# Patient Record
Sex: Male | Born: 1975 | Race: White | Hispanic: No | Marital: Married | Smoking: Never smoker
Health system: Southern US, Community
[De-identification: ages and names within clinical notes are randomized; demographics above are authoritative.]

## PROBLEM LIST (undated history)

## (undated) DIAGNOSIS — G43909 Migraine, unspecified, not intractable, without status migrainosus: Secondary | ICD-10-CM

## (undated) DIAGNOSIS — N4400 Torsion of testis, unspecified: Secondary | ICD-10-CM

## (undated) DIAGNOSIS — A4902 Methicillin resistant Staphylococcus aureus infection, unspecified site: Secondary | ICD-10-CM

## (undated) DIAGNOSIS — R569 Unspecified convulsions: Secondary | ICD-10-CM

## (undated) DIAGNOSIS — M4690 Unspecified inflammatory spondylopathy, site unspecified: Secondary | ICD-10-CM

## (undated) HISTORY — PX: SURGERY SCROTAL / TESTICULAR: SUR1316

---

## 2004-03-09 ENCOUNTER — Ambulatory Visit (HOSPITAL_COMMUNITY): Admission: RE | Admit: 2004-03-09 | Discharge: 2004-03-09 | Payer: Self-pay | Admitting: *Deleted

## 2004-03-09 ENCOUNTER — Encounter (INDEPENDENT_AMBULATORY_CARE_PROVIDER_SITE_OTHER): Payer: Self-pay | Admitting: Specialist

## 2005-05-20 ENCOUNTER — Ambulatory Visit (HOSPITAL_COMMUNITY): Admission: RE | Admit: 2005-05-20 | Discharge: 2005-05-20 | Payer: Self-pay | Admitting: *Deleted

## 2005-05-20 ENCOUNTER — Encounter (INDEPENDENT_AMBULATORY_CARE_PROVIDER_SITE_OTHER): Payer: Self-pay | Admitting: *Deleted

## 2006-07-17 ENCOUNTER — Ambulatory Visit (HOSPITAL_COMMUNITY): Admission: RE | Admit: 2006-07-17 | Discharge: 2006-07-17 | Payer: Self-pay

## 2006-08-31 ENCOUNTER — Emergency Department (HOSPITAL_COMMUNITY): Admission: EM | Admit: 2006-08-31 | Discharge: 2006-08-31 | Payer: Self-pay | Admitting: Emergency Medicine

## 2007-03-30 DIAGNOSIS — M457 Ankylosing spondylitis of lumbosacral region: Secondary | ICD-10-CM

## 2007-03-30 HISTORY — DX: Ankylosing spondylitis of lumbosacral region: M45.7

## 2010-06-29 ENCOUNTER — Other Ambulatory Visit: Payer: Self-pay | Admitting: Internal Medicine

## 2010-06-29 DIAGNOSIS — M542 Cervicalgia: Secondary | ICD-10-CM

## 2010-07-01 ENCOUNTER — Emergency Department (HOSPITAL_COMMUNITY): Payer: BC Managed Care – PPO

## 2010-07-01 ENCOUNTER — Emergency Department (HOSPITAL_COMMUNITY)
Admission: EM | Admit: 2010-07-01 | Discharge: 2010-07-01 | Disposition: A | Discharge status: Home / Self Care | Payer: BC Managed Care – PPO | Attending: Emergency Medicine | Admitting: Emergency Medicine

## 2010-07-01 DIAGNOSIS — R569 Unspecified convulsions: Secondary | ICD-10-CM | POA: Insufficient documentation

## 2010-07-01 DIAGNOSIS — R51 Headache: Secondary | ICD-10-CM | POA: Insufficient documentation

## 2010-07-01 LAB — POCT I-STAT, CHEM 8
Glucose, Bld: 95 mg/dL (ref 70–99)
HCT: 42 % (ref 39.0–52.0)
Hemoglobin: 14.3 g/dL (ref 13.0–17.0)
Potassium: 3.8 mEq/L (ref 3.5–5.1)
Sodium: 140 mEq/L (ref 135–145)
TCO2: 27 mmol/L (ref 0–100)

## 2010-07-02 ENCOUNTER — Other Ambulatory Visit: Payer: BC Managed Care – PPO

## 2010-07-02 ENCOUNTER — Other Ambulatory Visit: Payer: Self-pay

## 2010-07-30 NOTE — Op Note (Signed)
Bruce Valentine, KISSNER        ACCOUNT NO.:  [id number]   MEDICAL RECORD NO.:  [id number]          PATIENT TYPE:  AMB   LOCATION:  ENDO                         FACILITY:  [facility]   PHYSICIAN:  Georgiana Spinner, M.D.    DATE OF BIRTH:  [birth year]   DATE OF PROCEDURE:  05/20/2005  DATE OF DISCHARGE:                                 OPERATIVE REPORT   PROCEDURE:  Upper endoscopy.   ENDOSCOPIST:  Georgiana Spinner, M.D.   INDICATIONS:  Acute GI bleeding.   ANESTHESIA:  Demerol 70, Versed 7.5 mg.   DESCRIPTION OF PROCEDURE:  With the patient mildly sedated in the left  lateral decubitus position, the Olympus videoscopic endoscope was inserted  in the mouth and passed under direct vision through the esophagus which  appeared normal into the stomach.  The fundus, body and antrum were  visualized as was duodenal bulb, second portion duodenum.  From this point,  the endoscope was slowly withdrawn taking circumferential views of duodenal  mucosa until the endoscope had been pulled back into the stomach, at which  point an ulcer was seen between the folds.  With some insufflation we were  above this area.  It had a flat white base of approximately 1 cm in size,  and it was photographed and biopsied around the periphery of the ulcer.  The  endoscope was placed in retroflexion to view the stomach from below.  The  endoscope was then straightened and withdrawn taking circumferential views  of the remaining gastric and esophageal mucosa.  The patient's vital signs,  pulse oximeter remained stable.  The patient tolerated procedure well  without apparent complications.   FINDINGS:  Small erosions in the duodenal bulb and an approximately 1 cm in  size ulcer with a fairly bland base and benign-appearing surrounding mucosa  was biopsied.   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  We will follow  him as an  outpatient.  Await biopsy report. The patient will call me for  results and follow up with me as an outpatient.           ______________________________  Georgiana Spinner, M.D.     GMO/MEDQ  D:  05/20/2005  T:  05/21/2005  Job:  [id number]

## 2010-07-30 NOTE — Op Note (Signed)
NAMEGURKIRAT, BASHER        ACCOUNT NO.:  [id number]   MEDICAL RECORD NO.:  [id number]          PATIENT TYPE:  AMB   LOCATION:  ENDO                         FACILITY:  [facility]   PHYSICIAN:  Georgiana Spinner, M.D.    DATE OF BIRTH:  May 24, 1975   DATE OF PROCEDURE:  03/09/2004  DATE OF DISCHARGE:                                 OPERATIVE REPORT   PROCEDURE:  Colonoscopy.   ENDOSCOPIST:  Georgiana Spinner, M.D.   INDICATIONS FOR PROCEDURE:  Rectal bleeding with diarrhea.   ANESTHESIA:  Demerol 100 mg, Versed 12.5 mg.   DESCRIPTION OF PROCEDURE:  With the patient mildly sedated in the left  lateral decubitus position, the Olympus videoscopic colonoscope was inserted  in the rectum.  After a normal rectal examination, it passed under direct  vision to the cecum, identified by crow's foot of the cecum and the  ileocecal valve, both of which were photographed.  We entered easily into  the terminal ileum which also appeared normal and was photographed only.  From this point the colonoscope was slowly withdrawn, taking circumferential  views of the colon mucosa, stopping in the descending colon where the mucosa  appeared in spots hyperemic.  There appeared to be some aphthous  erosions/ulcerations.  Photographs and biopsies were taken of these, and we  pulled back all the way to the rectum, where it showed some changes in the  rectum of inflammatory change, which was photographed in direct view and  biopsied in direct view, and in a retroflexed view photographs were taken.  The endoscope was straightened and withdrawn.  The patient's vital signs and  pulse oximeter remained stable.  The patient tolerated the procedure well without apparent complications.   FINDINGS:  1.  Distal proctitis, biopsied.  Await biopsy report.  2.  Sporadic changes through the colon of hyperemia and possibly aphthous      ulceration.  Again, await biopsy report.  The patient will call me for      results and  follow up with me as an outpatient.       GMO/MEDQ  D:  03/09/2004  T:  03/09/2004  Job:  [id number]

## 2011-02-22 ENCOUNTER — Emergency Department (HOSPITAL_COMMUNITY)
Admission: EM | Admit: 2011-02-22 | Discharge: 2011-02-22 | Disposition: A | Discharge status: Home / Self Care | Payer: BC Managed Care – PPO | Attending: Emergency Medicine | Admitting: Emergency Medicine

## 2011-02-22 DIAGNOSIS — R062 Wheezing: Secondary | ICD-10-CM

## 2011-02-22 DIAGNOSIS — R0609 Other forms of dyspnea: Secondary | ICD-10-CM | POA: Insufficient documentation

## 2011-02-22 DIAGNOSIS — G40909 Epilepsy, unspecified, not intractable, without status epilepticus: Secondary | ICD-10-CM | POA: Insufficient documentation

## 2011-02-22 DIAGNOSIS — R0989 Other specified symptoms and signs involving the circulatory and respiratory systems: Secondary | ICD-10-CM | POA: Insufficient documentation

## 2011-02-22 DIAGNOSIS — Z79899 Other long term (current) drug therapy: Secondary | ICD-10-CM | POA: Insufficient documentation

## 2011-02-22 DIAGNOSIS — R21 Rash and other nonspecific skin eruption: Secondary | ICD-10-CM

## 2011-02-22 HISTORY — DX: Unspecified inflammatory spondylopathy, site unspecified: M46.90

## 2011-02-22 HISTORY — DX: Unspecified convulsions: R56.9

## 2011-02-22 HISTORY — DX: Methicillin resistant Staphylococcus aureus infection, unspecified site: A49.02

## 2011-02-22 HISTORY — DX: Torsion of testis, unspecified: N44.00

## 2011-02-22 MED ORDER — FAMOTIDINE 20 MG PO TABS: 20.0000 mg | ORAL_TABLET | Freq: Two times a day (BID) | ORAL | Status: DC

## 2011-02-22 MED ORDER — ALBUTEROL SULFATE HFA 108 (90 BASE) MCG/ACT IN AERS
INHALATION_SPRAY | RESPIRATORY_TRACT | Status: AC
  Filled 2011-02-22: qty 6.7

## 2011-02-22 MED ORDER — ALBUTEROL SULFATE HFA 108 (90 BASE) MCG/ACT IN AERS: 2.0000 | INHALATION_SPRAY | RESPIRATORY_TRACT | Status: DC | PRN

## 2011-02-22 MED ORDER — FAMOTIDINE 20 MG PO TABS
20.0000 mg | ORAL_TABLET | Freq: Once | ORAL | Status: AC
  Administered 2011-02-22: 20 mg via ORAL
  Filled 2011-02-22: qty 1

## 2011-02-22 MED ORDER — ALBUTEROL SULFATE HFA 108 (90 BASE) MCG/ACT IN AERS
1.0000 | INHALATION_SPRAY | RESPIRATORY_TRACT | Status: DC | PRN
  Administered 2011-02-22: 2 via RESPIRATORY_TRACT

## 2011-02-22 MED ORDER — IPRATROPIUM BROMIDE 0.02 % IN SOLN
0.5000 mg | RESPIRATORY_TRACT | Status: DC
  Administered 2011-02-22: 0.5 mg via RESPIRATORY_TRACT
  Filled 2011-02-22: qty 2.5

## 2011-02-22 MED ORDER — ALBUTEROL SULFATE (5 MG/ML) 0.5% IN NEBU
2.5000 mg | INHALATION_SOLUTION | RESPIRATORY_TRACT | Status: DC
  Administered 2011-02-22: 2.5 mg via RESPIRATORY_TRACT
  Filled 2011-02-22: qty 0.5

## 2011-02-22 NOTE — ED Provider Notes (Signed)
History     CSN: [id number] Arrival date & time: 02/22/2011  2:12 PM   First MD Initiated Contact with Patient 02/22/11 1443      No chief complaint on file.   (Consider location/radiation/quality/duration/timing/severity/associated sxs/prior treatment) HPI The patient presents with concerns of wheezing. Notably, the patient has a history of enclosing spondylitis with chronic Enbrel use. He was in his usual state of health until awakening 2 days ago. He does not recall particularly different activities, medications, diet prior to that time, but he woke with rash and wheezing with mild dyspnea. The rash was a lateral upper extremity and across the chest. He denied any pain, lightheadedness. With those symptoms he went to an urgent care clinic where he was started on prednisone, Benadryl. He notes that over the interval 2 days his symptoms have improved substantially with near resolution of rash. He presents today because he is wheezing recur. He notes the recurrence happen spontaneously without clear precipitant, and has not improved in spite of taking today's prednisone and Benadryl. He denies any chest pain, lightheadedness, vomiting, nausea, new rash.  Past Medical History  Diagnosis Date  . Spondylitis   . MRSA (methicillin resistant Staphylococcus aureus)   . Testicular torsion   . Seizure     History reviewed. No pertinent past surgical history.  No family history on file.  History  Substance Use Topics  . Smoking status: Never Smoker   . Smokeless tobacco: Current User  . Alcohol Use: No      Review of Systems  Constitutional:       Per HPI, otherwise negative  HENT:       Per HPI, otherwise negative  Eyes: Negative.   Respiratory:       Per HPI, otherwise negative  Cardiovascular:       Per HPI, otherwise negative  Gastrointestinal: Negative for vomiting.  Genitourinary: Negative.   Musculoskeletal:       Per HPI, otherwise negative  Skin: Negative.     Neurological: Negative for syncope.    Allergies  Review of patient's allergies indicates no known allergies.  Home Medications   Current Outpatient Rx  Name Route Sig Dispense Refill  . ETANERCEPT 50 MG/ML [place] SOLN Subcutaneous Inject 50 mg into the skin once a week. On Sunday     . PREDNISONE 20 MG PO TABS Oral Take 20 mg by mouth See admin instructions. Taper dose 3 tablet for 3 days, then 2 tablet for 3 days, then 1 tablet for 3 days.  60mg   dose today 02/22/11       BP 145/105  Pulse 94  Temp(Src) 98.3 F (36.8 C) (Oral)  Resp 18  SpO2 96%  Physical Exam  Nursing note and vitals reviewed. Constitutional: He is oriented to person, place, and time. He appears well-developed and well-nourished.  HENT:  Head: Normocephalic and atraumatic.  Eyes: Conjunctivae are normal. Pupils are equal, round, and reactive to light.  Neck: Neck supple.  Cardiovascular: Normal rate and regular rhythm.   Pulmonary/Chest: Breath sounds normal. No respiratory distress.       Inspiratory wheeze audible  Abdominal: Soft. There is no tenderness.  Musculoskeletal: He exhibits no edema.  Neurological: He is alert and oriented to person, place, and time.  Skin: Skin is warm and dry. Rash noted.       There is a patchy, minimally raised erythematous rash to the right and left anterior arms, less substantially there is a similar rash about the superior chest.  Psychiatric: He has a normal mood and affect.    ED Course  Procedures (including critical care time)  Labs Reviewed - No data to display No results found.   No diagnosis found.  Pulse ox 97% on room air normal  MDM  This previously well young male presents with several days of wheezing, prior rash which is now resolving. On exam the patient's inspiratory wheeze is audible, though his lung sounds are clear. The patient received one albuterol nebulizer treatment with resolution of his wheezing. Given the patient's description of  symptoms is concern for ongoing inflammatory state. The patient was advised to continue taking prednisone 60 mg daily for a total of 5 days, as well as to continue Benadryl and Pepcid then to followup with his primary care physician and/or rheumatologist.        Gerhard Munch, MD 02/22/11 1630

## 2011-02-22 NOTE — ED Notes (Signed)
Seen by pmd for allergic rx to unknown source and taking meds as prescribed. Pt still with hives under arms and redness to face and trunk. Lung sounds clear to auscultation.

## 2012-02-23 ENCOUNTER — Encounter (HOSPITAL_COMMUNITY): Payer: Self-pay

## 2012-02-23 ENCOUNTER — Emergency Department (HOSPITAL_COMMUNITY)
Admission: EM | Admit: 2012-02-23 | Discharge: 2012-02-23 | Disposition: A | Discharge status: Home / Self Care | Payer: BC Managed Care – PPO | Attending: Emergency Medicine | Admitting: Emergency Medicine

## 2012-02-23 DIAGNOSIS — G40909 Epilepsy, unspecified, not intractable, without status epilepticus: Secondary | ICD-10-CM | POA: Insufficient documentation

## 2012-02-23 DIAGNOSIS — M479 Spondylosis, unspecified: Secondary | ICD-10-CM | POA: Insufficient documentation

## 2012-02-23 DIAGNOSIS — R112 Nausea with vomiting, unspecified: Secondary | ICD-10-CM | POA: Insufficient documentation

## 2012-02-23 DIAGNOSIS — Z79899 Other long term (current) drug therapy: Secondary | ICD-10-CM | POA: Insufficient documentation

## 2012-02-23 DIAGNOSIS — Z8669 Personal history of other diseases of the nervous system and sense organs: Secondary | ICD-10-CM | POA: Insufficient documentation

## 2012-02-23 DIAGNOSIS — G43909 Migraine, unspecified, not intractable, without status migrainosus: Secondary | ICD-10-CM | POA: Insufficient documentation

## 2012-02-23 DIAGNOSIS — Z8614 Personal history of Methicillin resistant Staphylococcus aureus infection: Secondary | ICD-10-CM | POA: Insufficient documentation

## 2012-02-23 HISTORY — DX: Migraine, unspecified, not intractable, without status migrainosus: G43.909

## 2012-02-23 MED ORDER — KETOROLAC TROMETHAMINE 30 MG/ML IJ SOLN
30.0000 mg | Freq: Once | INTRAMUSCULAR | Status: AC
  Administered 2012-02-23: 30 mg via INTRAVENOUS
  Filled 2012-02-23: qty 1

## 2012-02-23 MED ORDER — SODIUM CHLORIDE 0.9 % IV BOLUS (SEPSIS)
1000.0000 mL | Freq: Once | INTRAVENOUS | Status: AC
  Administered 2012-02-23: 1000 mL via INTRAVENOUS

## 2012-02-23 MED ORDER — METOCLOPRAMIDE HCL 5 MG/ML IJ SOLN
10.0000 mg | Freq: Once | INTRAMUSCULAR | Status: AC
  Administered 2012-02-23: 10 mg via INTRAVENOUS
  Filled 2012-02-23: qty 2

## 2012-02-23 MED ORDER — DIPHENHYDRAMINE HCL 50 MG/ML IJ SOLN
25.0000 mg | Freq: Once | INTRAMUSCULAR | Status: AC
  Administered 2012-02-23: 25 mg via INTRAVENOUS
  Filled 2012-02-23: qty 1

## 2012-02-23 NOTE — ED Provider Notes (Signed)
History     CSN: [id number]  Arrival date & time 02/23/12  1108   First MD Initiated Contact with Patient 02/23/12 1316      Chief Complaint  Patient presents with  . Headache    (Consider location/radiation/quality/duration/timing/severity/associated sxs/prior treatment) Patient is a 36 y.o. male presenting with headaches.  Headache  This is a recurrent problem. The current episode started 2 days ago. The headache is associated with bright light. The pain is located in the temporal region. The quality of the pain is described as throbbing. The pain is at a severity of 10/10. The pain is moderate. The pain does not radiate. Associated symptoms include near-syncope, shortness of breath, nausea and vomiting. Pertinent negatives include no fever. Treatments tried: replax. The treatment provided mild relief.   36 year old male coming in with his typical migraine headache. Today the pain is on his left temporal x2 days with nausea and vomiting x1. Patient is taking relpax for his migraine headaches. He goes to Dr. Glyn Ade in [facility] a neurologist for his migraine headaches. This headache started yesterday around 10 AM. 10 throbbing pain 8/10. He has had migraine headaches for 3 years. His last migraine headache was 9 months ago. States that he gets a metallic taste arches migraine headaches and they usually come when he is dehydrated. He is has increased stress at his job. Denies fever or neck pain today. Neuro intact.   Past Medical History  Diagnosis Date  . Spondylitis   . MRSA (methicillin resistant Staphylococcus aureus)   . Testicular torsion   . Seizure   . Migraines     Past Surgical History  Procedure Date  . Surgery scrotal / testicular     for torsion    No family history on file.  History  Substance Use Topics  . Smoking status: Never Smoker   . Smokeless tobacco: Current User  . Alcohol Use: No      Review of Systems  Constitutional: Negative.  Negative  for fever.  HENT: Negative for neck pain, neck stiffness and sinus pressure.   Eyes: Negative.   Respiratory: Positive for shortness of breath.   Cardiovascular: Positive for near-syncope.  Gastrointestinal: Positive for nausea and vomiting.  Musculoskeletal: Negative for back pain.  Neurological: Positive for light-headedness and headaches. Negative for dizziness, facial asymmetry, weakness and numbness.  Psychiatric/Behavioral: Negative.  The patient is not nervous/anxious.   All other systems reviewed and are negative.    Allergies  Review of patient's allergies indicates no known allergies.  Home Medications   Current Outpatient Rx  Name  Route  Sig  Dispense  Refill  . ELETRIPTAN HYDROBROMIDE 40 MG PO TABS   Oral   One tablet by mouth at onset of headache. May repeat in 2 hours if headache persists or recurs. may repeat in 2 hours if necessary         . ETANERCEPT 50 MG/ML [place] SOLN   Subcutaneous   Inject 50 mg into the skin once a week. On Sunday            BP 152/83  Pulse 69  Temp 97.8 F (36.6 C) (Oral)  Resp 18  Ht 5\' 7"  (1.702 m)  Wt 170 lb (77.111 kg)  BMI 26.63 kg/m2  SpO2 97%  Physical Exam  Nursing note and vitals reviewed. Constitutional: He is oriented to person, place, and time. He appears well-developed and well-nourished.  HENT:  Head: Normocephalic.  Eyes: Conjunctivae normal and EOM are normal. Pupils  are equal, round, and reactive to light.  Neck: Normal range of motion. Neck supple.  Cardiovascular: Normal rate.   Pulmonary/Chest: Effort normal and breath sounds normal. No respiratory distress.  Abdominal: Soft.  Musculoskeletal: Normal range of motion.  Neurological: He is alert and oriented to person, place, and time. He has normal reflexes. No cranial nerve deficit. Coordination normal.  Skin: Skin is warm and dry.  Psychiatric: He has a normal mood and affect.    ED Course  Procedures (including critical care time)  Labs  Reviewed - No data to display No results found.   No diagnosis found.    MDM  36 year old coming in with his typical migraine headache. Migraine cocktail was given in the ER with good relief. Patient will followup with Dr. Glyn Ade his neurologist in [place] this week. Patient understands to return for worsening symptoms.        Remi Haggard, NP 02/24/12 [phone]

## 2012-02-23 NOTE — ED Notes (Addendum)
Headache started bothering him this morning. Also has N/V with it. Pt has a hx of migraines. Pt sts he has fainted twice in the past two weeks. Had an EKG completed in the urgent care and referred to cardiologist from there.

## 2012-02-23 NOTE — ED Notes (Signed)
IV start unsuccessful x 2. 

## 2012-02-24 NOTE — ED Provider Notes (Signed)
Medical screening examination/treatment/procedure(s) were performed by non-physician practitioner and as supervising physician I was immediately available for consultation/collaboration.   Laray Anger, DO 02/24/12 [phone]

## 2012-03-19 ENCOUNTER — Institutional Professional Consult (permissible substitution): Payer: BC Managed Care – PPO | Admitting: Cardiovascular Disease

## 2012-06-03 IMAGING — CT CT HEAD W/O CM
1 of 2 series · 13 of 30 positions shown, 17 images · non-contrast
Comparison: 08/31/2006.

CLINICAL DATA: New onset seizures.

CT HEAD WITHOUT CONTRAST
TECHNIQUE: Contiguous axial images were obtained from the base of
the skull through the vertex without contrast.

[Series 2: brain · axial · 0.47mm/px · z∈[+156,+287]mm · 13 of 32 slices shown, 17 images]
[im 3/32  brain]
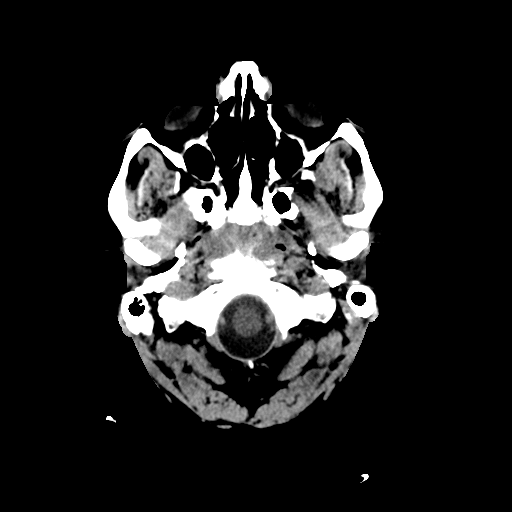
[im 3/32  bone]
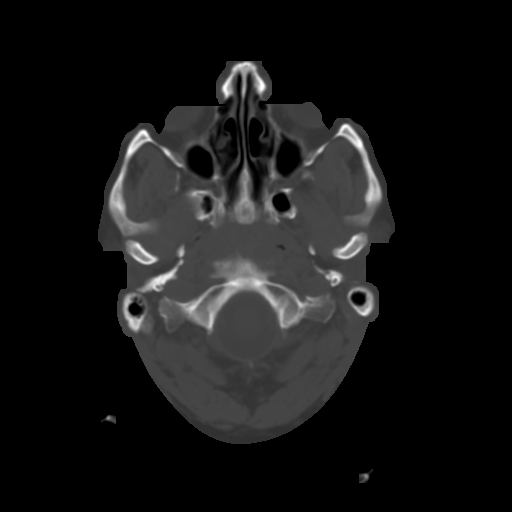
[im 5/32  brain]
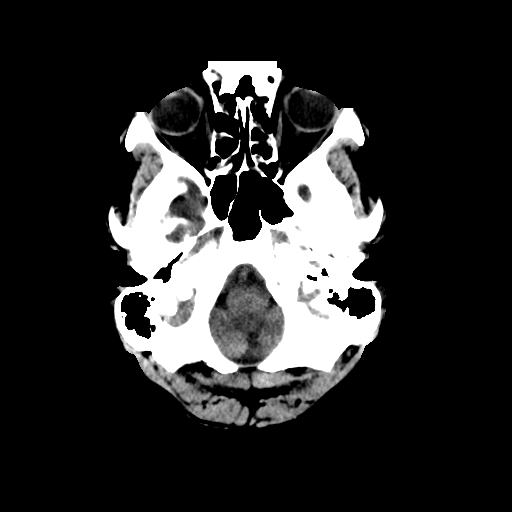
[im 7/32  brain]
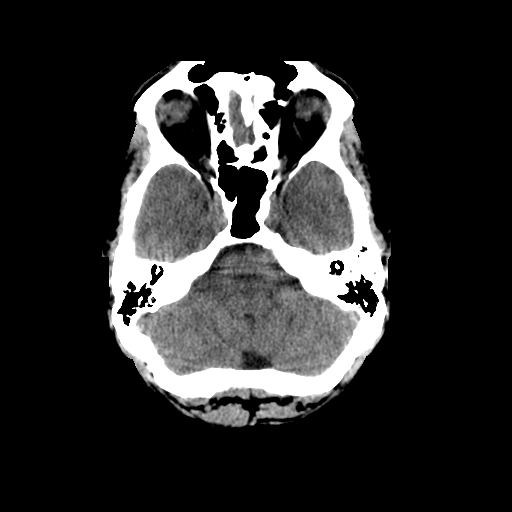
[im 9/32  brain]
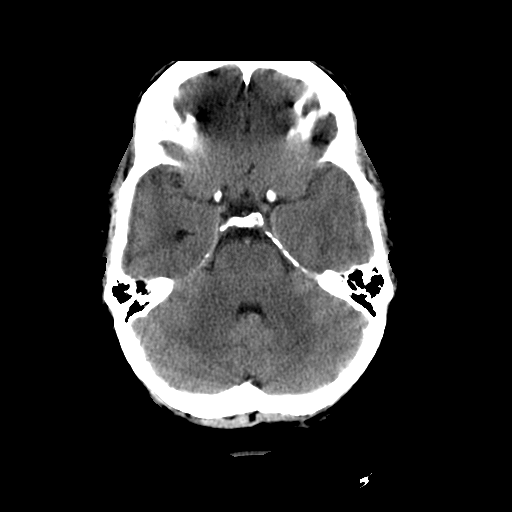
[im 12/32  brain]
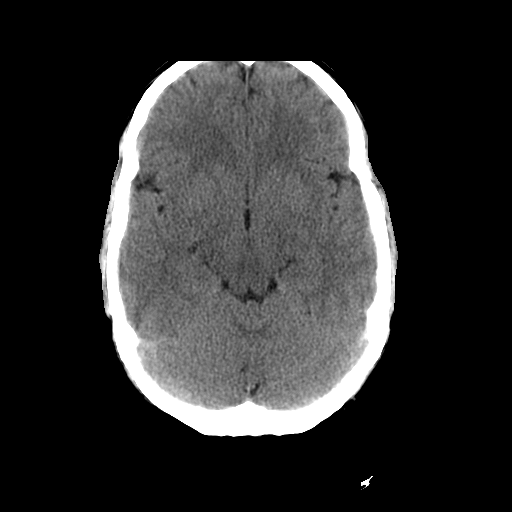
[im 12/32  bone]
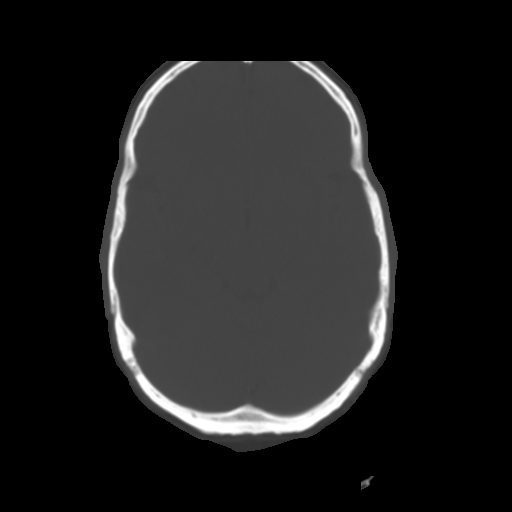
[im 14/32  brain]
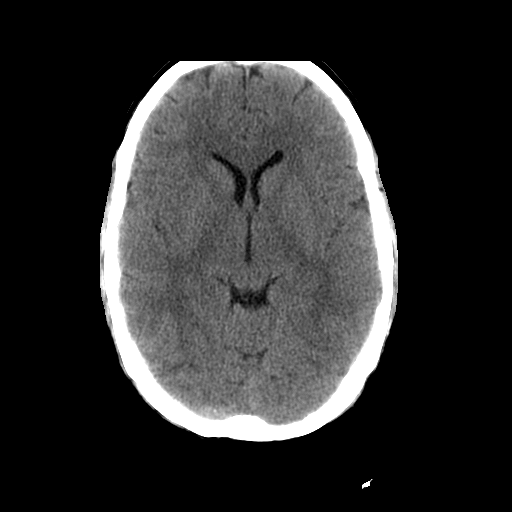
[im 16/32  brain]
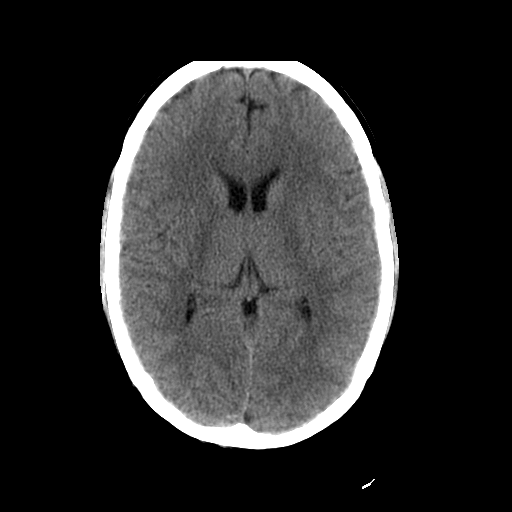
[im 18/32  brain]
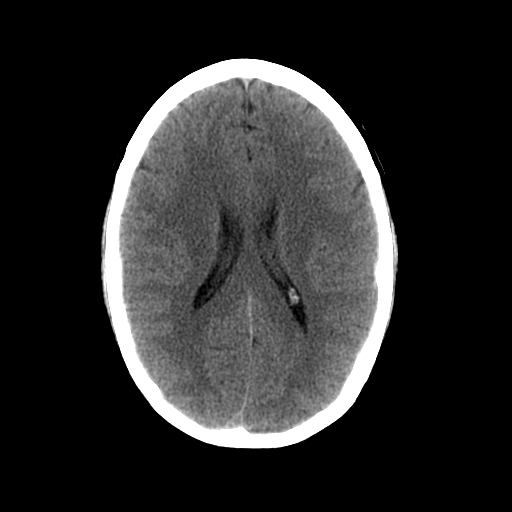
[im 20/32  brain]
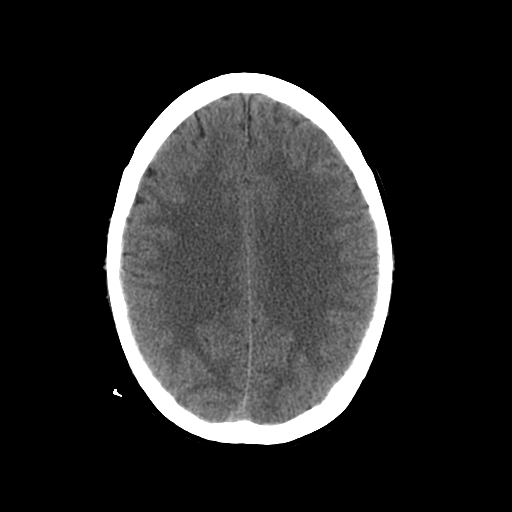
[im 20/32  bone]
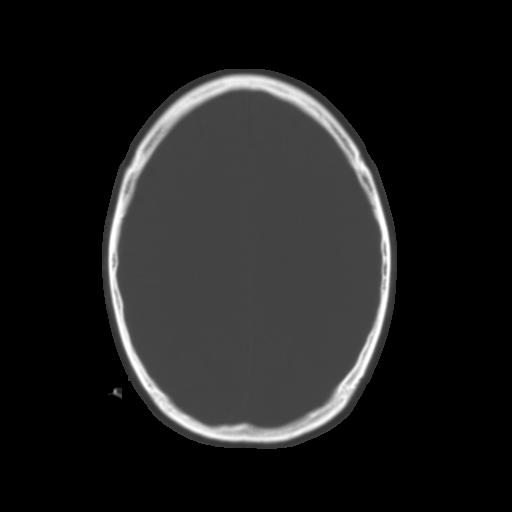
[im 23/32  brain]
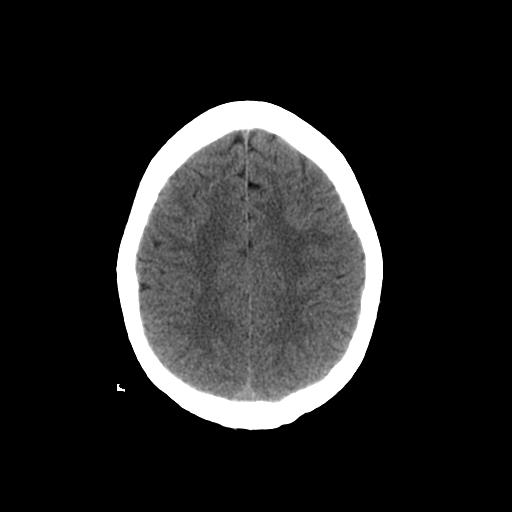
[im 25/32  brain]
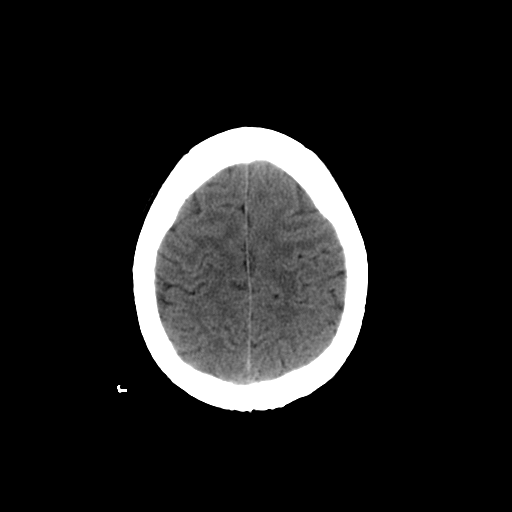
[im 27/32  brain]
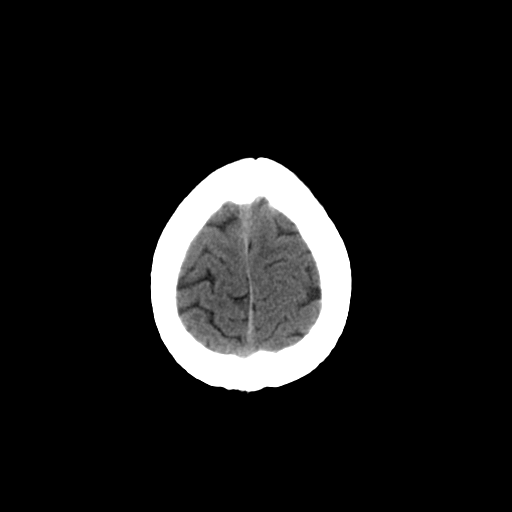
[im 29/32  brain]
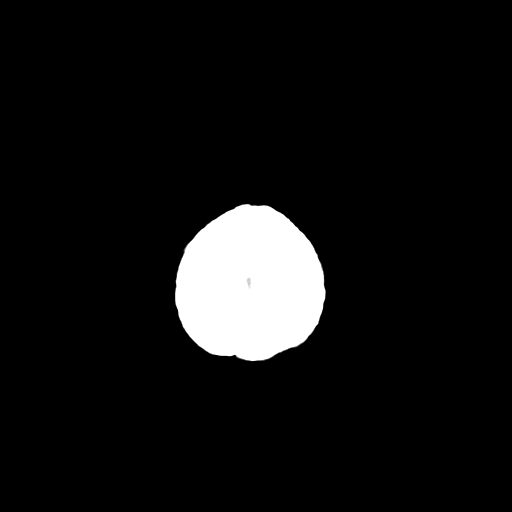
[im 29/32  bone]
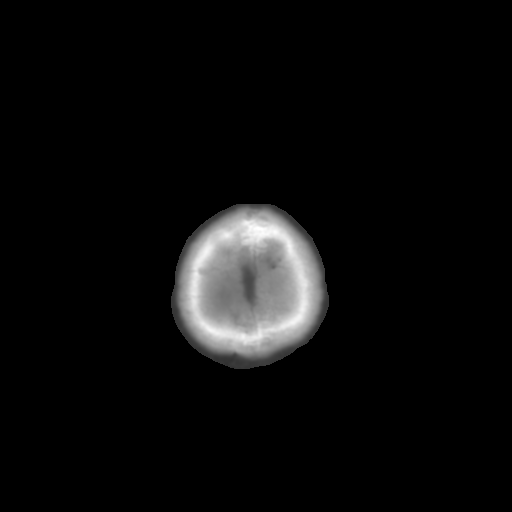

[13 of 30 positions shown; findings below may reference images not displayed]

FINDINGS: No intracranial hemorrhage. No CT evidence of large
acute infarct.  Small acute infarct cannot be excluded by CT. No
intracranial mass detected on this unenhanced exam.  Minimal
asymmetry of the temporal horns with the right larger than left
unchanged.  Cerebellar tonsils minimally low-lying probably within
range normal limits and unchanged.

Overall, seizure focus is not identified.  MR imaging would prove
more sensitive for detection of such.

Minimal mucosal thickening ethmoid sinus air cells and left
maxillary sinus.
IMPRESSION: Seizure focus not identified on this unenhanced head CT as noted
above.

## 2018-04-02 DIAGNOSIS — R29818 Other symptoms and signs involving the nervous system: Secondary | ICD-10-CM

## 2018-04-02 DIAGNOSIS — R200 Anesthesia of skin: Secondary | ICD-10-CM | POA: Insufficient documentation

## 2018-04-02 DIAGNOSIS — G8929 Other chronic pain: Secondary | ICD-10-CM

## 2018-04-02 DIAGNOSIS — M25512 Pain in left shoulder: Secondary | ICD-10-CM | POA: Insufficient documentation

## 2018-04-02 HISTORY — DX: Other chronic pain: G89.29

## 2018-04-02 HISTORY — DX: Other symptoms and signs involving the nervous system: R29.818

## 2018-05-15 ENCOUNTER — Other Ambulatory Visit: Payer: Self-pay | Admitting: Psychiatry

## 2018-05-15 DIAGNOSIS — M25512 Pain in left shoulder: Principal | ICD-10-CM

## 2018-05-15 DIAGNOSIS — G8929 Other chronic pain: Secondary | ICD-10-CM

## 2018-05-21 DIAGNOSIS — M4722 Other spondylosis with radiculopathy, cervical region: Secondary | ICD-10-CM | POA: Insufficient documentation

## 2018-05-21 HISTORY — DX: Other spondylosis with radiculopathy, cervical region: M47.22

## 2019-01-25 DIAGNOSIS — M5020 Other cervical disc displacement, unspecified cervical region: Secondary | ICD-10-CM | POA: Insufficient documentation

## 2019-01-25 HISTORY — DX: Other cervical disc displacement, unspecified cervical region: M50.20

## 2019-04-18 DIAGNOSIS — R030 Elevated blood-pressure reading, without diagnosis of hypertension: Secondary | ICD-10-CM

## 2019-04-18 HISTORY — DX: Elevated blood-pressure reading, without diagnosis of hypertension: R03.0

## 2019-06-06 DIAGNOSIS — I10 Essential (primary) hypertension: Secondary | ICD-10-CM | POA: Insufficient documentation

## 2019-06-06 HISTORY — DX: Essential (primary) hypertension: I10

## 2020-01-13 ENCOUNTER — Other Ambulatory Visit: Payer: Self-pay

## 2020-01-13 ENCOUNTER — Encounter (HOSPITAL_BASED_OUTPATIENT_CLINIC_OR_DEPARTMENT_OTHER): Payer: Self-pay

## 2020-01-13 ENCOUNTER — Emergency Department (HOSPITAL_BASED_OUTPATIENT_CLINIC_OR_DEPARTMENT_OTHER)
Admission: EM | Admit: 2020-01-13 | Discharge: 2020-01-13 | Disposition: A | Discharge status: Home / Self Care | Payer: BC Managed Care – PPO | Attending: Emergency Medicine | Admitting: Emergency Medicine

## 2020-01-13 DIAGNOSIS — Z8614 Personal history of Methicillin resistant Staphylococcus aureus infection: Secondary | ICD-10-CM | POA: Diagnosis not present

## 2020-01-13 DIAGNOSIS — L02214 Cutaneous abscess of groin: Secondary | ICD-10-CM | POA: Insufficient documentation

## 2020-01-13 DIAGNOSIS — L0291 Cutaneous abscess, unspecified: Secondary | ICD-10-CM

## 2020-01-13 DIAGNOSIS — B9689 Other specified bacterial agents as the cause of diseases classified elsewhere: Secondary | ICD-10-CM | POA: Diagnosis not present

## 2020-01-13 MED ORDER — CLINDAMYCIN HCL 150 MG PO CAPS: 450.0000 mg | ORAL_CAPSULE | Freq: Three times a day (TID) | ORAL | 0 refills | Status: AC

## 2020-01-13 MED ORDER — CLINDAMYCIN HCL 150 MG PO CAPS
450.0000 mg | ORAL_CAPSULE | Freq: Once | ORAL | Status: AC
  Administered 2020-01-13: 450 mg via ORAL
  Filled 2020-01-13: qty 3

## 2020-01-13 NOTE — ED Provider Notes (Signed)
[facility] EMERGENCY DEPARTMENT Provider Note   CSN: [id number] Arrival date & time: 01/13/20  1728     History Chief Complaint  Patient presents with  . Abscess    groin    Bruce Valentine is a 44 y.o. male.  The history is provided by the patient.  Abscess  Bruce Valentine is a 44 y.o. male who presents to the Emergency Department complaining of abscess. He presents the emergency department complaining of swelling and pain to his right groin that started three days ago. 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 He also has ankylosing spondylitis and is on chronic Enbrel therapy. Three days ago he had some pain and swelling to his right groin. It became larger last night and today felt like a baby. No reports of fevers, chest pain, shortness of breath, nausea, vomiting, abdominal pain, dysuria. No history of diabetes.    Past Medical History:  Diagnosis Date  . Migraines   . MRSA (methicillin resistant Staphylococcus aureus)   . Seizure (HCC)   . Spondylitis (HCC)   . Testicular torsion     There are no problems to display for this patient.   Past Surgical History:  Procedure Laterality Date  . SURGERY SCROTAL / TESTICULAR     for torsion       No family history on file.  Social History   Tobacco Use  . Smoking status: Never Smoker  . Smokeless tobacco: Current User  Substance Use Topics  . Alcohol use: Yes  . Drug use: Yes    Types: Marijuana    Home Medications Prior to Admission medications   Medication Sig Start Date End Date Taking? Authorizing Provider  cyclobenzaprine (FLEXERIL) 10 MG tablet TAKE 1 TABLET(10 MG) BY MOUTH TWICE DAILY AS NEEDED FOR MUSCLE SPASMS 11/07/19  Yes [provider]  fenofibrate 160 MG tablet Take by mouth. 02/24/18  Yes [provider]  clindamycin (CLEOCIN) 150 MG capsule Take 3 capsules (450 mg total) by mouth 3 (three) times daily for 7 days. 01/13/20 01/20/20   Tilden Fossa, MD  eletriptan (RELPAX) 40 MG tablet One tablet by mouth at onset of headache. May repeat in 2 hours if headache persists or recurs. may repeat in 2 hours if necessary    [provider]  etanercept (ENBREL) 50 MG/ML injection Inject 50 mg into the skin once a week. On Sunday     [provider]    Allergies    Patient has no known allergies.  Review of Systems   Review of Systems  All other systems reviewed and are negative.   Physical Exam Updated Vital Signs BP (!) 123/94   Pulse 95   Temp 98.8 F (37.1 C) (Oral)   Resp 18   Ht 5\' 8"  (1.727 m)   Wt 81.6 kg   SpO2 99%   BMI 27.37 kg/m   Physical Exam Vitals and nursing note reviewed.  Constitutional:      Appearance: He is well-developed.  HENT:     Head: Normocephalic and atraumatic.  Cardiovascular:     Rate and Rhythm: Normal rate and regular rhythm.     Heart sounds: No murmur heard.   Pulmonary:     Effort: Pulmonary effort is normal. No respiratory distress.     Breath sounds: Normal breath sounds.  Abdominal:     Palpations: Abdomen is soft.     Tenderness: There is no abdominal tenderness. There is  no guarding or rebound.  Genitourinary:    Comments: Normal penis and testes. There is a 1 cm abscess just superior to the penis that is spontaneously draining. No significant cellulitis. No inguinal lymphadenopathy. Musculoskeletal:        General: No tenderness.  Skin:    General: Skin is warm and dry.  Neurological:     Mental Status: He is alert and oriented to person, place, and time.  Psychiatric:     Comments: Anxious appearing     ED Results / Procedures / Treatments   Labs (all labs ordered are listed, but only abnormal results are displayed) Labs Reviewed - No data to display  EKG None  Radiology No results found.  Procedures Procedures (including critical care time)  Medications Ordered in ED Medications  clindamycin (CLEOCIN) capsule 450 mg (has  no administration in time range)    ED Course  I have reviewed the triage vital signs and the nursing notes.  Pertinent labs & imaging results that were available during my care of the patient were reviewed by me and considered in my medical decision making (see chart for details).    MDM Rules/Calculators/A&P                         Patient with history of MR SA here for evaluation of groin swelling. Examination is consistent with spontaneously draining abscess. No need for additional I and D this is a very small abscess. Given his history of recurrent abscesses, he has required surgery in the past will start on antibiotics. Discussed home care, outpatient follow-up and return precautions.  Final Clinical Impression(s) / ED Diagnoses Final diagnoses:  Abscess    Rx / DC Orders ED Discharge Orders         Ordered    clindamycin (CLEOCIN) 150 MG capsule  3 times daily        01/13/20 1948           Tilden Fossa, MD 01/13/20 1951

## 2020-01-13 NOTE — ED Triage Notes (Signed)
Pt reports abscess to groin, reports history of having abscesses drained in the past. Pt reports area appear about 2 days ago.

## 2020-08-21 ENCOUNTER — Encounter: Payer: Self-pay | Admitting: *Deleted

## 2020-08-21 ENCOUNTER — Encounter: Payer: Self-pay | Admitting: Cardiology

## 2020-08-24 ENCOUNTER — Encounter: Payer: Self-pay | Admitting: Cardiology

## 2020-08-24 ENCOUNTER — Ambulatory Visit: Payer: BC Managed Care – PPO

## 2020-08-24 ENCOUNTER — Other Ambulatory Visit: Payer: Self-pay

## 2020-08-24 ENCOUNTER — Ambulatory Visit: Payer: BC Managed Care – PPO | Admitting: Cardiology

## 2020-08-24 VITALS — BP 114/80 | HR 65 | Ht 68.0 in | Wt 198.0 lb

## 2020-08-24 DIAGNOSIS — I493 Ventricular premature depolarization: Secondary | ICD-10-CM

## 2020-08-24 DIAGNOSIS — R030 Elevated blood-pressure reading, without diagnosis of hypertension: Secondary | ICD-10-CM | POA: Diagnosis not present

## 2020-08-24 DIAGNOSIS — R0602 Shortness of breath: Secondary | ICD-10-CM

## 2020-08-24 DIAGNOSIS — R002 Palpitations: Secondary | ICD-10-CM

## 2020-08-24 DIAGNOSIS — E669 Obesity, unspecified: Secondary | ICD-10-CM

## 2020-08-24 NOTE — Patient Instructions (Signed)
Medication Instructions:  Your physician recommends that you continue on your current medications as directed. Please refer to the Current Medication list given to you today.  *If you need a refill on your cardiac medications before your next appointment, please call your pharmacy*   Lab Work: None If you have labs (blood work) drawn today and your tests are completely normal, you will receive your results only by: MyChart Message (if you have MyChart) OR A paper copy in the mail If you have any lab test that is abnormal or we need to change your treatment, we will call you to review the results.   Testing/Procedures: Your physician has requested that you have an echocardiogram. Echocardiography is a painless test that uses sound waves to create images of your heart. It provides your doctor with information about the size and shape of your heart and how well your heart's chambers and valves are working. This procedure takes approximately one hour. There are no restrictions for this procedure.  A zio monitor was ordered today. It will remain on for 14 days. You will then return monitor and event diary in provided box. It takes 1-2 weeks for report to be downloaded and returned to [place]. We will call you with the results. If monitor falls off or has orange flashing light, please call Zio for further instructions.     Follow-Up: At [facility], you and your health needs are our priority.  As part of our continuing mission to provide you with exceptional heart care, we have created designated Provider Care Teams.  These Care Teams include your primary Cardiologist (physician) and Advanced Practice Providers (APPs -  Physician Assistants and Nurse Practitioners) who all work together to provide you with the care you need, when you need it.  We recommend signing up for the patient portal called "MyChart".  Sign up information is provided on this After Visit Summary.  MyChart is used to connect with  patients for Virtual Visits (Telemedicine).  Patients are able to view lab/test results, encounter notes, upcoming appointments, etc.  Non-urgent messages can be sent to your provider as well.   To learn more about what you can do with MyChart, go to [email or web address].    Your next appointment:   3 month(s)  The format for your next appointment:   In Person  Provider:   Thomasene Ripple, DO   Other Instructions Echocardiogram An echocardiogram is a test that uses sound waves (ultrasound) to produce images of the heart. Images from an echocardiogram can provide important information about: Heart size and shape. The size and thickness and movement of your heart's walls. Heart muscle function and strength. Heart valve function or if you have stenosis. Stenosis is when the heart valves are too narrow. If blood is flowing backward through the heart valves (regurgitation). A tumor or infectious growth around the heart valves. Areas of heart muscle that are not working well because of poor blood flow or injury from a heart attack. Aneurysm detection. An aneurysm is a weak or damaged part of an artery wall. The wall bulges out from the normal force of blood pumping through the body. Tell a health care provider about: Any allergies you have. All medicines you are taking, including vitamins, herbs, eye drops, creams, and over-the-counter medicines. Any blood disorders you have. Any surgeries you have had. Any medical conditions you have. Whether you are pregnant or may be pregnant. What are the risks? Generally, this is a safe test. However, problems may  occur, including an allergic reaction to dye (contrast) that may be used during the test. What happens before the test? No specific preparation is needed. You may eat and drink normally. What happens during the test?  You will take off your clothes from the waist up and put on a hospital gown. Electrodes or electrocardiogram  (ECG)patches may be placed on your chest. The electrodes or patches are then connected to a device that monitors your heart rate and rhythm. You will lie down on a table for an ultrasound exam. A gel will be applied to your chest to help sound waves pass through your skin. A handheld device, called a transducer, will be pressed against your chest and moved over your heart. The transducer produces sound waves that travel to your heart and bounce back (or "echo" back) to the transducer. These sound waves will be captured in real-time and changed into images of your heart that can be viewed on a video monitor. The images will be recorded on a computer and reviewed by your health care provider. You may be asked to change positions or hold your breath for a short time. This makes it easier to get different views or better views of your heart. In some cases, you may receive contrast through an IV in one of your veins. This can improve the quality of the pictures from your heart. The procedure may vary among health care providers and hospitals. What can I expect after the test? You may return to your normal, everyday life, including diet, activities, andmedicines, unless your health care provider tells you not to do that. Follow these instructions at home: It is up to you to get the results of your test. Ask your health care provider, or the department that is doing the test, when your results will be ready. Keep all follow-up visits. This is important. Summary An echocardiogram is a test that uses sound waves (ultrasound) to produce images of the heart. Images from an echocardiogram can provide important information about the size and shape of your heart, heart muscle function, heart valve function, and other possible heart problems. You do not need to do anything to prepare before this test. You may eat and drink normally. After the echocardiogram is completed, you may return to your normal, everyday life,  unless your health care provider tells you not to do that. This information is not intended to replace advice given to you by your health care provider. Make sure you discuss any questions you have with your healthcare provider. Document Revised: 10/22/2019 Document Reviewed: 10/22/2019 Elsevier Patient Education  2022 [employer]   ZIO  WHY IS MY DOCTOR PRESCRIBING ZIO? The Zio system is proven and trusted by physicians to detect and diagnose irregular heart rhythms -- and has been prescribed to hundreds of thousands of patients.  The FDA has cleared the Zio system to monitor for many different kinds of irregular heart rhythms. In a study, physicians were able to reach a diagnosis 90% of the time with the Zio system1.  You can wear the Zio monitor -- a small, discreet, comfortable patch -- during your normal day-to-day activity, including while you sleep, shower, and exercise, while it records every single heartbeat for analysis.  1Barrett, P., et al. Comparison of 24 Hour Holter Monitoring Versus 14 Day Novel Adhesive Patch Electrocardiographic Monitoring. American Journal of Medicine, 2014.  ZIO VS. HOLTER MONITORING The Zio monitor can be comfortably worn for up to 14 days. Holter monitors can be  worn for 24 to 48 hours, limiting the time to record any irregular heart rhythms you may have. Zio is able to capture data for the 51% of patients who have their first symptom-triggered arrhythmia after 48 hours.1  LIVE WITHOUT RESTRICTIONS The Zio ambulatory cardiac monitor is a small, unobtrusive, and water-resistant patch--you might even forget you're wearing it. The Zio monitor records and stores every beat of your heart, whether you're sleeping, working out, or showering. Remove on: June 27th 2022

## 2020-08-24 NOTE — Progress Notes (Signed)
Cardiology Office Note:    Date:  08/24/2020   ID:  Bruce Valentine, DOB [birth year], MRN [id number]  PCP:  Bruce Laster, MD  Cardiologist:  Thomasene Ripple, DO  Electrophysiologist:  None   Referring MD: Bruce Laster, MD   No chief complaint on file. I have been experiencing intermittent palpitations and sometimes Milbauer Charcot high  History of Present Illness:    Bruce Valentine is a 45 y.o. male with a hx of hypertension, seizures is here today at the request of his primary care provider to be evaluated for palpitations.  Patient tells me about since January he has had intermittent palpitations and he is concerned.  He notes that it does not happen all the time but when he does his heart rate goes significantly high he feels some chest tightness and then sometimes he has to really sit down and braced himself as the sensation passes.  He has never passed out and he denies any shortness of breath.  Past Medical History:  Diagnosis Date   Ankylosing spondylitis of lumbosacral region (HCC) 03/30/2007   Cervical disc herniation 01/25/2019   Cervical spondylosis with radiculopathy 05/21/2018   Chronic left shoulder pain 04/02/2018   Elevated blood-pressure reading, without diagnosis of hypertension 04/18/2019   Essential (primary) hypertension 06/06/2019   Migraines    MRSA (methicillin resistant Staphylococcus aureus)    Positive Lhermitte's sign 04/02/2018   Seizure (HCC)    Spondylitis (HCC)    Testicular torsion     Past Surgical History:  Procedure Laterality Date   SURGERY SCROTAL / TESTICULAR     for torsion    Current Medications: Current Meds  Medication Sig   cyclobenzaprine (FLEXERIL) 10 MG tablet TAKE 1 TABLET(10 MG) BY MOUTH TWICE DAILY AS NEEDED FOR MUSCLE SPASMS   fenofibrate 160 MG tablet Take 160 mg by mouth daily.   HUMIRA PEN 40 MG/0.4ML PNKT Inject 40 mg into the skin every 14 (fourteen) days.   propranolol (INDERAL) 40 MG tablet Take 1-2 tablets  by mouth every 6 (six) hours as needed for chest pain.     Allergies:   Patient has no known allergies.   Social History   Socioeconomic History   Marital status: Married    Spouse name: Not on file   Number of children: Not on file   Years of education: Not on file   Highest education level: Not on file  Occupational History   Not on file  Tobacco Use   Smoking status: Never   Smokeless tobacco: Current  Substance and Sexual Activity   Alcohol use: Yes    Alcohol/week: 2.0 standard drinks    Types: 2 Cans of beer per week   Drug use: Yes    Types: Marijuana   Sexual activity: Not on file  Other Topics Concern   Not on file  Social History Narrative   Not on file   Social Determinants of Health   Financial Resource Strain: Not on file  Food Insecurity: Not on file  Transportation Needs: Not on file  Physical Activity: Not on file  Stress: Not on file  Social Connections: Not on file     Family History: The patient's family history is not on file.  ROS:   Review of Systems  Constitution: Negative for decreased appetite, fever and weight gain.  HENT: Negative for congestion, ear discharge, hoarse voice and sore throat.   Eyes: Negative for discharge, redness, vision loss in right eye and visual  halos.  Cardiovascular: Reports palpitations.  Negative for chest pain, dyspnea on exertion, leg swelling, orthopnea.Respiratory: Negative for cough, hemoptysis, shortness of breath and snoring.   Endocrine: Negative for heat intolerance and polyphagia.  Hematologic/Lymphatic: Negative for bleeding problem. Does not bruise/bleed easily.  Skin: Negative for flushing, nail changes, rash and suspicious lesions.  Musculoskeletal: Negative for arthritis, joint pain, muscle cramps, myalgias, neck pain and stiffness.  Gastrointestinal: Negative for abdominal pain, bowel incontinence, diarrhea and excessive appetite.  Genitourinary: Negative for decreased libido, genital sores and  incomplete emptying.  Neurological: Negative for brief paralysis, focal weakness, headaches and loss of balance.  Psychiatric/Behavioral: Negative for altered mental status, depression and suicidal ideas.  Allergic/Immunologic: Negative for HIV exposure and persistent infections.    EKGs/Labs/Other Studies Reviewed:    The following studies were reviewed today:   EKG:  The ekg ordered today demonstrates sinus rhythm, with occasional PVCs Recent Labs: No results found for requested labs within last 8760 hours.  Recent Lipid Panel No results found for: CHOL, TRIG, HDL, CHOLHDL, VLDL, LDLCALC, LDLDIRECT  Physical Exam:    VS:  BP 114/80 (BP Location: Right Arm)   Pulse 65   Ht 5\' 8"  (1.727 m)   Wt 198 lb (89.8 kg)   SpO2 99%   BMI 30.11 kg/m     Wt Readings from Last 3 Encounters:  08/24/20 198 lb (89.8 kg)  08/21/20 204 lb (92.5 kg)  01/13/20 180 lb (81.6 kg)     GEN: Well nourished, well developed in no acute distress HEENT: Normal NECK: No JVD; No carotid bruits LYMPHATICS: No lymphadenopathy CARDIAC: S1S2 noted,RRR, no murmurs, rubs, gallops RESPIRATORY:  Clear to auscultation without rales, wheezing or rhonchi  ABDOMEN: Soft, non-tender, non-distended, +bowel sounds, no guarding. EXTREMITIES: No edema, No cyanosis, no clubbing MUSCULOSKELETAL:  No deformity  SKIN: Warm and dry NEUROLOGIC:  Alert and oriented x 3, non-focal PSYCHIATRIC:  Normal affect, good insight  ASSESSMENT:    1. SOB (shortness of breath)   2. Elevated BP without diagnosis of hypertension   3. PVC (premature ventricular contraction)   4. Palpitations   5. Obesity (BMI 30-39.9)    PLAN:    His PVC may be playing a role but at this time I would like to placed a zio patch for   14 days days to assess PVC burden and for any other arrhythmia.  He has started taking his propanolol daily as directed by his PCP asked the patient to continue on aspirin  In additon a transthoracic echocardiogram  will be ordered to assess LV/RV function and any structural abnormalities. Once these testing have been performed amd reviewed further reccomendations will be made. For now, I do reccomend that the patient goes to the nearest ED if  symptoms recur.  The patient understands the need to lose weight with diet and exercise. We have discussed specific strategies for this.  His automated blood pressure cuff shows that his blood pressure increases intermittently but today he is normally in the office.  The patient is in agreement with the above plan. The patient left the office in stable condition.  The patient will follow up in 3 months or sooner if needed   Medication Adjustments/Labs and Tests Ordered: Current medicines are reviewed at length with the patient today.  Concerns regarding medicines are outlined above.  Orders Placed This Encounter  Procedures   LONG TERM MONITOR (3-14 DAYS)   EKG 12-Lead   ECHOCARDIOGRAM COMPLETE   No orders of the  defined types were placed in this encounter.   Patient Instructions  Medication Instructions:  Your physician recommends that you continue on your current medications as directed. Please refer to the Current Medication list given to you today.  *If you need a refill on your cardiac medications before your next appointment, please call your pharmacy*   Lab Work: None If you have labs (blood work) drawn today and your tests are completely normal, you will receive your results only by: MyChart Message (if you have MyChart) OR A paper copy in the mail If you have any lab test that is abnormal or we need to change your treatment, we will call you to review the results.   Testing/Procedures: Your physician has requested that you have an echocardiogram. Echocardiography is a painless test that uses sound waves to create images of your heart. It provides your doctor with information about the size and shape of your heart and how well your heart's  chambers and valves are working. This procedure takes approximately one hour. There are no restrictions for this procedure.  A zio monitor was ordered today. It will remain on for 14 days. You will then return monitor and event diary in provided box. It takes 1-2 weeks for report to be downloaded and returned to [place]. We will call you with the results. If monitor falls off or has orange flashing light, please call Zio for further instructions.     Follow-Up: At [facility], you and your health needs are our priority.  As part of our continuing mission to provide you with exceptional heart care, we have created designated Provider Care Teams.  These Care Teams include your primary Cardiologist (physician) and Advanced Practice Providers (APPs -  Physician Assistants and Nurse Practitioners) who all work together to provide you with the care you need, when you need it.  We recommend signing up for the patient portal called "MyChart".  Sign up information is provided on this After Visit Summary.  MyChart is used to connect with patients for Virtual Visits (Telemedicine).  Patients are able to view lab/test results, encounter notes, upcoming appointments, etc.  Non-urgent messages can be sent to your provider as well.   To learn more about what you can do with MyChart, go to [email or web address].    Your next appointment:   3 month(s)  The format for your next appointment:   In Person  Provider:   Thomasene Ripple, DO   Other Instructions Echocardiogram An echocardiogram is a test that uses sound waves (ultrasound) to produce images of the heart. Images from an echocardiogram can provide important information about: Heart size and shape. The size and thickness and movement of your heart's walls. Heart muscle function and strength. Heart valve function or if you have stenosis. Stenosis is when the heart valves are too narrow. If blood is flowing backward through the heart valves  (regurgitation). A tumor or infectious growth around the heart valves. Areas of heart muscle that are not working well because of poor blood flow or injury from a heart attack. Aneurysm detection. An aneurysm is a weak or damaged part of an artery wall. The wall bulges out from the normal force of blood pumping through the body. Tell a health care provider about: Any allergies you have. All medicines you are taking, including vitamins, herbs, eye drops, creams, and over-the-counter medicines. Any blood disorders you have. Any surgeries you have had. Any medical conditions you have. Whether you are pregnant or may be pregnant.  What are the risks? Generally, this is a safe test. However, problems may occur, including an allergic reaction to dye (contrast) that may be used during the test. What happens before the test? No specific preparation is needed. You may eat and drink normally. What happens during the test?  You will take off your clothes from the waist up and put on a hospital gown. Electrodes or electrocardiogram (ECG)patches may be placed on your chest. The electrodes or patches are then connected to a device that monitors your heart rate and rhythm. You will lie down on a table for an ultrasound exam. A gel will be applied to your chest to help sound waves pass through your skin. A handheld device, called a transducer, will be pressed against your chest and moved over your heart. The transducer produces sound waves that travel to your heart and bounce back (or "echo" back) to the transducer. These sound waves will be captured in real-time and changed into images of your heart that can be viewed on a video monitor. The images will be recorded on a computer and reviewed by your health care provider. You may be asked to change positions or hold your breath for a short time. This makes it easier to get different views or better views of your heart. In some cases, you may receive contrast  through an IV in one of your veins. This can improve the quality of the pictures from your heart. The procedure may vary among health care providers and hospitals. What can I expect after the test? You may return to your normal, everyday life, including diet, activities, andmedicines, unless your health care provider tells you not to do that. Follow these instructions at home: It is up to you to get the results of your test. Ask your health care provider, or the department that is doing the test, when your results will be ready. Keep all follow-up visits. This is important. Summary An echocardiogram is a test that uses sound waves (ultrasound) to produce images of the heart. Images from an echocardiogram can provide important information about the size and shape of your heart, heart muscle function, heart valve function, and other possible heart problems. You do not need to do anything to prepare before this test. You may eat and drink normally. After the echocardiogram is completed, you may return to your normal, everyday life, unless your health care provider tells you not to do that. This information is not intended to replace advice given to you by your health care provider. Make sure you discuss any questions you have with your healthcare provider. Document Revised: 10/22/2019 Document Reviewed: 10/22/2019 Elsevier Patient Education  2022 Elsevier Inc.   ZIO  WHY IS MY DOCTOR PRESCRIBING ZIO? The Zio system is proven and trusted by physicians to detect and diagnose irregular heart rhythms -- and has been prescribed to hundreds of thousands of patients.  The FDA has cleared the Zio system to monitor for many different kinds of irregular heart rhythms. In a study, physicians were able to reach a diagnosis 90% of the time with the Zio system1.  You can wear the Zio monitor -- a small, discreet, comfortable patch -- during your normal day-to-day activity, including while you sleep, shower,  and exercise, while it records every single heartbeat for analysis.  1Barrett, P., et al. Comparison of 24 Hour Holter Monitoring Versus 14 Day Novel Adhesive Patch Electrocardiographic Monitoring. American Journal of Medicine, 2014.  ZIO VS. HOLTER MONITORING The Zio monitor  can be comfortably worn for up to 14 days. Holter monitors can be worn for 24 to 48 hours, limiting the time to record any irregular heart rhythms you may have. Zio is able to capture data for the 51% of patients who have their first symptom-triggered arrhythmia after 48 hours.1  LIVE WITHOUT RESTRICTIONS The Zio ambulatory cardiac monitor is a small, unobtrusive, and water-resistant patch--you might even forget you're wearing it. The Zio monitor records and stores every beat of your heart, whether you're sleeping, working out, or showering. Remove on: June 27th 2022   Adopting a Healthy Lifestyle.  Know what a healthy weight is for you (roughly BMI <25) and aim to maintain this   Aim for 7+ servings of fruits and vegetables daily   65-80+ fluid ounces of water or unsweet tea for healthy kidneys   Limit to max 1 drink of alcohol per day; avoid smoking/tobacco   Limit animal fats in diet for cholesterol and heart health - choose grass fed whenever available   Avoid highly processed foods, and foods high in saturated/trans fats   Aim for low stress - take time to unwind and care for your mental health   Aim for 150 min of moderate intensity exercise weekly for heart health, and weights twice weekly for bone health   Aim for 7-9 hours of sleep daily   When it comes to diets, agreement about the perfect plan isnt easy to find, even among the experts. Experts at the [employer] developed an idea known as the Healthy Eating Plate. Just imagine a plate divided into logical, healthy portions.   The emphasis is on diet quality:   Load up on vegetables and fruits - one-half of your plate: Aim for  color and variety, and remember that potatoes dont count.   Go for whole grains - one-quarter of your plate: Whole wheat, barley, wheat berries, quinoa, oats, brown rice, and foods made with them. If you want pasta, go with whole wheat pasta.   Protein power - one-quarter of your plate: Fish, chicken, beans, and nuts are all healthy, versatile protein sources. Limit red meat.   The diet, however, does go beyond the plate, offering a few other suggestions.   Use healthy plant oils, such as olive, canola, soy, corn, sunflower and peanut. Check the labels, and avoid partially hydrogenated oil, which have unhealthy trans fats.   If youre thirsty, drink water. Coffee and tea are good in moderation, but skip sugary drinks and limit milk and dairy products to one or two daily servings.   The type of carbohydrate in the diet is more important than the amount. Some sources of carbohydrates, such as vegetables, fruits, whole grains, and beans-are healthier than others.   Finally, stay active  Signed, Thomasene RippleKardie Kayanna Mckillop, DO  08/24/2020 1:12 PM    [facility]

## 2020-09-16 ENCOUNTER — Other Ambulatory Visit (HOSPITAL_COMMUNITY): Payer: BC Managed Care – PPO

## 2020-10-14 ENCOUNTER — Ambulatory Visit (HOSPITAL_BASED_OUTPATIENT_CLINIC_OR_DEPARTMENT_OTHER)
Admission: RE | Admit: 2020-10-14 | Discharge: 2020-10-14 | Disposition: A | Discharge status: Home / Self Care | Payer: BC Managed Care – PPO | Source: Ambulatory Visit | Attending: Cardiology | Admitting: Cardiology

## 2020-10-14 ENCOUNTER — Other Ambulatory Visit: Payer: Self-pay

## 2020-10-14 DIAGNOSIS — R0602 Shortness of breath: Secondary | ICD-10-CM | POA: Insufficient documentation

## 2020-10-14 LAB — ECHOCARDIOGRAM COMPLETE
AR max vel: 4.71 cm2
AV Area VTI: 4.26 cm2
AV Area mean vel: 4.37 cm2
AV Mean grad: 3 mmHg
AV Peak grad: 6 mmHg
Ao pk vel: 1.22 m/s
Area-P 1/2: 4.52 cm2
Calc EF: 71.9 %
S' Lateral: 2.64 cm
Single Plane A2C EF: 78.7 %
Single Plane A4C EF: 61.1 %

## 2020-10-14 NOTE — Progress Notes (Signed)
*  PRELIMINARY RESULTS* Echocardiogram 2D Echocardiogram has been performed.  Neomia Dear RDCS 10/14/2020, 1:18 PM

## 2020-10-16 ENCOUNTER — Telehealth: Payer: Self-pay

## 2020-10-16 ENCOUNTER — Telehealth: Payer: Self-pay | Admitting: Cardiology

## 2020-10-16 NOTE — Telephone Encounter (Signed)
Patient notified of results and recommendations. He verbalized understanding.

## 2020-10-16 NOTE — Telephone Encounter (Signed)
-----   Message from Thomasene Ripple, DO sent at 10/14/2020  8:47 PM EDT ----- The left ventricle wall thickening which is in the setting of your hypertension you also do have impaired relaxation due to hypertension as well.  From this point the best thing for [place] to do is make sure your blood pressure is controlled.  No other intervention at this time.

## 2020-10-16 NOTE — Telephone Encounter (Signed)
Follow up:    Patient returning a call back concering results. Please leave a message if patient do not answer.

## 2020-11-09 DIAGNOSIS — R569 Unspecified convulsions: Secondary | ICD-10-CM | POA: Insufficient documentation

## 2020-11-09 DIAGNOSIS — G43909 Migraine, unspecified, not intractable, without status migrainosus: Secondary | ICD-10-CM | POA: Insufficient documentation

## 2020-11-09 DIAGNOSIS — M4690 Unspecified inflammatory spondylopathy, site unspecified: Secondary | ICD-10-CM | POA: Insufficient documentation

## 2020-11-09 DIAGNOSIS — N4400 Torsion of testis, unspecified: Secondary | ICD-10-CM | POA: Insufficient documentation

## 2020-11-09 DIAGNOSIS — A4902 Methicillin resistant Staphylococcus aureus infection, unspecified site: Secondary | ICD-10-CM | POA: Insufficient documentation

## 2020-11-18 ENCOUNTER — Ambulatory Visit: Payer: BC Managed Care – PPO | Admitting: Cardiology

## 2022-01-07 ENCOUNTER — Ambulatory Visit: Payer: BC Managed Care – PPO | Admitting: Cardiology

## 2022-01-24 ENCOUNTER — Ambulatory Visit: Payer: BC Managed Care – PPO | Attending: Cardiology | Admitting: Cardiology

## 2022-02-17 ENCOUNTER — Encounter: Payer: Self-pay | Admitting: Cardiology

## 2022-08-07 ENCOUNTER — Other Ambulatory Visit: Payer: Self-pay

## 2022-08-07 ENCOUNTER — Emergency Department (HOSPITAL_BASED_OUTPATIENT_CLINIC_OR_DEPARTMENT_OTHER)
Admission: EM | Admit: 2022-08-07 | Discharge: 2022-08-07 | Disposition: A | Discharge status: Home / Self Care | Payer: BC Managed Care – PPO | Attending: Emergency Medicine | Admitting: Emergency Medicine

## 2022-08-07 ENCOUNTER — Emergency Department (HOSPITAL_BASED_OUTPATIENT_CLINIC_OR_DEPARTMENT_OTHER): Payer: BC Managed Care – PPO

## 2022-08-07 ENCOUNTER — Encounter (HOSPITAL_BASED_OUTPATIENT_CLINIC_OR_DEPARTMENT_OTHER): Payer: Self-pay | Admitting: Emergency Medicine

## 2022-08-07 DIAGNOSIS — R0789 Other chest pain: Secondary | ICD-10-CM | POA: Diagnosis present

## 2022-08-07 DIAGNOSIS — R002 Palpitations: Secondary | ICD-10-CM | POA: Diagnosis not present

## 2022-08-07 DIAGNOSIS — Z794 Long term (current) use of insulin: Secondary | ICD-10-CM | POA: Diagnosis not present

## 2022-08-07 DIAGNOSIS — R079 Chest pain, unspecified: Secondary | ICD-10-CM

## 2022-08-07 LAB — COMPREHENSIVE METABOLIC PANEL
ALT: 14 U/L (ref 0–44)
AST: 15 U/L (ref 15–41)
Albumin: 3.7 g/dL (ref 3.5–5.0)
Alkaline Phosphatase: 88 U/L (ref 38–126)
Anion gap: 8 (ref 5–15)
BUN: 14 mg/dL (ref 6–20)
CO2: 23 mmol/L (ref 22–32)
Calcium: 8.9 mg/dL (ref 8.9–10.3)
Chloride: 104 mmol/L (ref 98–111)
Creatinine, Ser: 0.82 mg/dL (ref 0.61–1.24)
GFR, Estimated: >60 mL/min (ref >60)
Glucose, Bld: 105 mg/dL — ABNORMAL HIGH (ref 70–99)
Potassium: 3.6 mmol/L (ref 3.5–5.1)
Sodium: 135 mmol/L (ref 135–145)
Total Bilirubin: 0.4 mg/dL (ref 0.3–1.2)
Total Protein: 7.9 g/dL (ref 6.5–8.1)

## 2022-08-07 LAB — CBC WITH DIFFERENTIAL/PLATELET
Abs Immature Granulocytes: 0.03 10*3/uL (ref 0.00–0.07)
Basophils Absolute: 0.1 10*3/uL (ref 0.0–0.1)
Basophils Relative: 1 %
Eosinophils Absolute: 0.3 10*3/uL (ref 0.0–0.5)
Eosinophils Relative: 3 %
HCT: 44.4 % (ref 39.0–52.0)
Hemoglobin: 14.6 g/dL (ref 13.0–17.0)
Immature Granulocytes: 0 %
Lymphocytes Relative: 31 %
Lymphs Abs: 3.2 10*3/uL (ref 0.7–4.0)
MCH: 26.2 pg (ref 26.0–34.0)
MCHC: 32.9 g/dL (ref 30.0–36.0)
MCV: 79.7 fL — ABNORMAL LOW (ref 80.0–100.0)
Monocytes Absolute: 1.2 10*3/uL — ABNORMAL HIGH (ref 0.1–1.0)
Monocytes Relative: 11 %
Neutro Abs: 5.6 10*3/uL (ref 1.7–7.7)
Neutrophils Relative %: 54 %
Platelets: 370 10*3/uL (ref 150–400)
RBC: 5.57 MIL/uL (ref 4.22–5.81)
RDW: 16.1 % — ABNORMAL HIGH (ref 11.5–15.5)
WBC: 10.4 10*3/uL (ref 4.0–10.5)
nRBC: 0 % (ref 0.0–0.2)

## 2022-08-07 LAB — LIPASE, BLOOD: Lipase: 34 U/L (ref 11–51)

## 2022-08-07 LAB — TROPONIN I (HIGH SENSITIVITY): Troponin I (High Sensitivity): <2 ng/L (ref <18)

## 2022-08-07 LAB — D-DIMER, QUANTITATIVE: D-Dimer, Quant: 0.34 ug/mL-FEU (ref 0.00–0.50)

## 2022-08-07 NOTE — ED Triage Notes (Addendum)
Pt reports middle chest pain, increases with movement, describes as sharp (turning from side to side); diaphoresis, nausea; sxs x 1 mo

## 2022-08-07 NOTE — ED Provider Notes (Signed)
[place] [facility] Provider Note   CSN: [id number] Arrival date & time: 08/07/22  1834     History  Chief Complaint  Patient presents with   Chest Pain    Bruce Valentine is a 47 y.o. male.  Patient here with chest discomfort and palpitations at time.  No significant medical history.  Cardiac history in the family.  Denies any recent surgery or travel.  He had cardiac workup for similar process a while back and was unremarkable.  Diagnosed with PVCs.  He denies any active chest pain.  Does not really have exertional symptoms.  Does not think that it is related to eating or drinking.  Denies any shortness of breath.  Is not sure if movement makes it worse or not.  Denies any weakness, numbness, tingling.  The history is provided by the patient.       Home Medications Prior to Admission medications   Medication Sig Start Date End Date Taking? Authorizing Provider  cyclobenzaprine (FLEXERIL) 10 MG tablet TAKE 1 TABLET(10 MG) BY MOUTH TWICE DAILY AS NEEDED FOR MUSCLE SPASMS 11/07/19   [provider]  fenofibrate 160 MG tablet Take 160 mg by mouth daily. 02/24/18   [provider]  HUMIRA PEN 40 MG/0.4ML PNKT Inject 40 mg into the skin every 14 (fourteen) days. 08/05/20   [provider]  propranolol (INDERAL) 40 MG tablet Take 1-2 tablets by mouth every 6 (six) hours as needed for chest pain. 07/10/20   [provider]      Allergies    Other    Review of Systems   Review of Systems  Physical Exam Updated Vital Signs BP (!) 112/99   Pulse 77   Temp 98 F (36.7 C)   Resp 16   Ht 5\' 8"  (1.727 m)   Wt 90.7 kg   SpO2 98%   BMI 30.41 kg/m  Physical Exam Vitals and nursing note reviewed.  Constitutional:      General: He is not in acute distress.    Appearance: He is well-developed. He is not ill-appearing.  HENT:     Head: Normocephalic and atraumatic.  Eyes:     Extraocular Movements:  Extraocular movements intact.     Conjunctiva/sclera: Conjunctivae normal.     Pupils: Pupils are equal, round, and reactive to light.  Cardiovascular:     Rate and Rhythm: Normal rate and regular rhythm.     Pulses:          Radial pulses are 2+ on the right side and 2+ on the left side.     Heart sounds: Normal heart sounds. No murmur heard. Pulmonary:     Effort: Pulmonary effort is normal. No respiratory distress.     Breath sounds: Normal breath sounds. No decreased breath sounds or wheezing.  Abdominal:     Palpations: Abdomen is soft.     Tenderness: There is no abdominal tenderness.  Musculoskeletal:        General: No swelling. Normal range of motion.     Cervical back: Normal range of motion and neck supple.  Skin:    General: Skin is warm and dry.     Capillary Refill: Capillary refill takes less than 2 seconds.  Neurological:     General: No focal deficit present.     Mental Status: He is alert.  Psychiatric:        Mood and Affect: Mood normal.     ED Results /  Procedures / Treatments   Labs (all labs ordered are listed, but only abnormal results are displayed) Labs Reviewed  CBC WITH DIFFERENTIAL/PLATELET - Abnormal; Notable for the following components:      Result Value   MCV 79.7 (*)    RDW 16.1 (*)    Monocytes Absolute 1.2 (*)    All other components within normal limits  COMPREHENSIVE METABOLIC PANEL - Abnormal; Notable for the following components:   Glucose, Bld 105 (*)    All other components within normal limits  LIPASE, BLOOD  D-DIMER, QUANTITATIVE  TROPONIN I (HIGH SENSITIVITY)  TROPONIN I (HIGH SENSITIVITY)    EKG EKG Interpretation  Date/Time:  Sunday Aug 07 2022 18:41:52 EDT Ventricular Rate:  76 PR Interval:  148 QRS Duration: 84 QT Interval:  384 QTC Calculation: 432 R Axis:   50 Text Interpretation: Normal sinus rhythm Normal ECG No previous ECGs available Confirmed by Eveline Sauve ([id number]) on 08/07/2022 6:46:42  PM  Radiology DG Chest Portable 1 View  Result Date: 08/07/2022 CLINICAL DATA:  Chest pain EXAM: PORTABLE CHEST 1 VIEW COMPARISON:  None Available. FINDINGS: The heart size and mediastinal contours are within normal limits. Both lungs are clear. The visualized skeletal structures are unremarkable. IMPRESSION: No active disease. Electronically Signed   By: Kevin  Dover M.D.   On: 08/07/2022 19:31    Procedures Procedures    Medications Ordered in ED Medications - No data to display  ED Course/ Medical Decision Making/ A&P                             Medical Decision Making Amount and/or Complexity of Data Reviewed Labs: ordered. Radiology: ordered.   Collier S Burkman here with chest pain and palpitations.  Normal vitals.  No fever.  No major significant medical problems.  Cardiac history in the family.  Differential diagnosis could be muscular process or GI related process but will evaluate for ACS, PE, pneumonia.  Will get CBC, CMP, lipase, troponin, D-dimer, chest x-ray.  EKG per my review and interpretation shows sinus rhythm.  No ischemic changes.  Per my review and interpretation labs no significant anemia, electrolyte abnormality, kidney injury or leukocytosis.  Troponin and D-dimer normal.  Overall nonspecific is sounding chest pain.  However will have him follow-up with cardiology given his family history.  Discharged in good condition.  This chart was dictated using voice recognition software.  Despite best efforts to proofread,  errors can occur which can change the documentation meaning.         Final Clinical Impression(s) / ED Diagnoses Final diagnoses:  Nonspecific chest pain    Rx / DC Orders ED Discharge Orders          Ordered    Ambulatory referral to Cardiology        05 /26/24 2019              Virgina Norfolk, DO 08/07/22 2019

## 2022-09-14 ENCOUNTER — Ambulatory Visit: Payer: BC Managed Care – PPO | Attending: Nurse Practitioner | Admitting: Nurse Practitioner

## 2022-09-14 NOTE — Progress Notes (Deleted)
Office Visit    Patient Name: Bruce Valentine Date of Encounter: 09/14/2022  Primary Care Provider:  Juanita Laster, MD Primary Cardiologist:  Thomasene Ripple, DO  Chief Complaint    47 year old male with a history of chest pain, palpitations, PVCs, hypertension, migraines, seizures, and ankylosing spondylitis who presents for follow-up related to chest pain.  Past Medical History    Past Medical History:  Diagnosis Date   Ankylosing spondylitis of lumbosacral region (HCC) 03/30/2007   Cervical disc herniation 01/25/2019   Cervical spondylosis with radiculopathy 05/21/2018   Chronic left shoulder pain 04/02/2018   Elevated blood-pressure reading, without diagnosis of hypertension 04/18/2019   Essential (primary) hypertension 06/06/2019   Migraines    MRSA (methicillin resistant Staphylococcus aureus)    Positive Lhermitte's sign 04/02/2018   Seizure (HCC)    Spondylitis (HCC)    Testicular torsion    Past Surgical History:  Procedure Laterality Date   SURGERY SCROTAL / TESTICULAR     for torsion    Allergies  Allergies  Allergen Reactions   Other     Allergy to a medication for Bipolar     Labs/Other Studies Reviewed    The following studies were reviewed today: *** Cardiac Studies & Procedures       ECHOCARDIOGRAM  ECHOCARDIOGRAM COMPLETE 10/14/2020  Narrative ECHOCARDIOGRAM REPORT    Patient Name:   Bruce Valentine Date of Exam: 10/14/2020 Medical Rec #:  [id number]              Height:       68.0 in Accession #:    [id number]             Weight:       198.0 lb Date of Birth:  17-Jul-1975               BSA:          2.035 m Patient Age:    45 years               BP:           135/84 mmHg Patient Gender: M                      HR:           86 bpm. Exam Location:  [place]  Procedure: 2D Echo, Cardiac Doppler and Color Doppler  Indications:    SOB  History:        Patient has no prior history of Echocardiogram examinations. Risk  Factors:Hypertension.  Sonographer:    Neomia Dear RDCS Referring Phys: [id number] KARDIE TOBB   Sonographer Comments: Image acquisition challenging due to respiratory motion and Image acquisition challenging due to patient body habitus. IMPRESSIONS   1. Left ventricular ejection fraction, by estimation, is 60 to 65%. The left ventricle has normal function. The left ventricle has no regional wall motion abnormalities. There is moderate asymmetric left ventricular hypertrophy of the septal segment. Left ventricular diastolic parameters are consistent with Grade I diastolic dysfunction (impaired relaxation). 2. Right ventricular systolic function is normal. The right ventricular size is normal. There is normal pulmonary artery systolic pressure. 3. The mitral valve is normal in structure. Trivial mitral valve regurgitation. No evidence of mitral stenosis. 4. The aortic valve is normal in structure. Aortic valve regurgitation is not visualized. No aortic stenosis is present. 5. The inferior vena cava is normal in size with greater than 50% respiratory variability, suggesting right atrial  pressure of 3 mmHg.  Comparison(s): No prior Echocardiogram.  FINDINGS Left Ventricle: Left ventricular ejection fraction, by estimation, is 60 to 65%. The left ventricle has normal function. The left ventricle has no regional wall motion abnormalities. The left ventricular internal cavity size was normal in size. There is moderate asymmetric left ventricular hypertrophy of the septal segment. Left ventricular diastolic parameters are consistent with Grade I diastolic dysfunction (impaired relaxation).  Right Ventricle: The right ventricular size is normal. No increase in right ventricular wall thickness. Right ventricular systolic function is normal. There is normal pulmonary artery systolic pressure. The tricuspid regurgitant velocity is 2.17 m/s, and with an assumed right atrial pressure of 3 mmHg, the  estimated right ventricular systolic pressure is 21.8 mmHg.  Left Atrium: Left atrial size was normal in size.  Right Atrium: Right atrial size was normal in size.  Pericardium: There is no evidence of pericardial effusion. Presence of pericardial fat pad.  Mitral Valve: The mitral valve is normal in structure. Trivial mitral valve regurgitation. No evidence of mitral valve stenosis.  Tricuspid Valve: The tricuspid valve is normal in structure. Tricuspid valve regurgitation is trivial. No evidence of tricuspid stenosis.  Aortic Valve: The aortic valve is normal in structure. Aortic valve regurgitation is not visualized. No aortic stenosis is present. Aortic valve mean gradient measures 3.0 mmHg. Aortic valve peak gradient measures 6.0 mmHg. Aortic valve area, by VTI measures 4.26 cm.  Pulmonic Valve: The pulmonic valve was not well visualized. Pulmonic valve regurgitation is not visualized. No evidence of pulmonic stenosis.  Aorta: The aortic root is normal in size and structure.  Venous: The inferior vena cava is normal in size with greater than 50% respiratory variability, suggesting right atrial pressure of 3 mmHg.  IAS/Shunts: No atrial level shunt detected by color flow Doppler.   LEFT VENTRICLE PLAX 2D LVIDd:         4.56 cm     Diastology LVIDs:         2.64 cm     LV e' medial:    6.74 cm/s LV PW:         1.04 cm     LV E/e' medial:  10.2 LV IVS:        1.35 cm     LV e' lateral:   9.79 cm/s LVOT diam:     2.50 cm     LV E/e' lateral: 7.0 LV SV:         101 LV SV Index:   49 LVOT Area:     4.91 cm  LV Volumes (MOD) LV vol d, MOD A2C: 42.3 ml LV vol d, MOD A4C: 67.8 ml LV vol s, MOD A2C: 9.0 ml LV vol s, MOD A4C: 26.4 ml LV SV MOD A2C:     33.3 ml LV SV MOD A4C:     67.8 ml LV SV MOD BP:      39.2 ml  RIGHT VENTRICLE RV Basal diam:  3.85 cm  PULMONARY VEINS RV Mid diam:    2.13 cm  A Reversal Duration: 103.00 msec A Reversal Velocity: 34.30 cm/s Diastolic  Velocity:  49.30 cm/s S/D Velocity:        1.20 Systolic Velocity:   60.00 cm/s  LEFT ATRIUM             Index       RIGHT ATRIUM           Index LA diam:  3.60 cm 1.77 cm/m  RA Area:     12.80 cm LA Vol (A2C):   37.4 ml 18.38 ml/m RA Volume:   29.50 ml  14.50 ml/m LA Vol (A4C):   31.3 ml 15.38 ml/m LA Biplane Vol: 34.2 ml 16.81 ml/m AORTIC VALVE                   PULMONIC VALVE AV Area (Vmax):    4.71 cm    PV Vmax:       0.97 m/s AV Area (Vmean):   4.37 cm    PV Vmean:      69.600 cm/s AV Area (VTI):     4.26 cm    PV VTI:        0.227 m AV Vmax:           122.00 cm/s PV Peak grad:  3.7 mmHg AV Vmean:          87.300 cm/s PV Mean grad:  2.0 mmHg AV VTI:            0.236 m AV Peak Grad:      6.0 mmHg AV Mean Grad:      3.0 mmHg LVOT Vmax:         117.00 cm/s LVOT Vmean:        77.700 cm/s LVOT VTI:          0.205 m LVOT/AV VTI ratio: 0.87  AORTA Ao Root diam: 3.50 cm Ao Asc diam:  3.15 cm  MITRAL VALVE               TRICUSPID VALVE MV Area (PHT): 4.52 cm    TR Peak grad:   18.8 mmHg MV Decel Time: 168 msec    TR Vmax:        217.00 cm/s MV E velocity: 68.60 cm/s MV A velocity: 70.30 cm/s  SHUNTS MV E/A ratio:  0.98        Systemic VTI:  0.20 m Systemic Diam: 2.50 cm  Kardie Tobb DO Electronically signed by Thomasene Ripple DO Signature Date/Time: 10/14/2020/7:35:05 PM    Final            Recent Labs: 08/07/2022: ALT 14; BUN 14; Creatinine, Ser 0.82; Hemoglobin 14.6; Platelets 370; Potassium 3.6; Sodium 135  Recent Lipid Panel No results found for: "CHOL", "TRIG", "HDL", "CHOLHDL", "VLDL", "LDLCALC", "LDLDIRECT"  History of Present Illness    47 year old male with with the above past medical history including chest pain, palpitations,PVCs, hypertension, migraines, seizures, and ankylosing spondylitis.  He was referred to Dr. Servando Salina in June 2022 in the setting of palpitations with associated chest tightness.  He was noted to have PVCs on EKG.  14-day ZIO  monitor was ordered but not completed.  Echocardiogram in 10/2020 showed EF 60 to 65%, normal LV function, no RWMA, moderate asymmetric LVH of the septal segment, G1 DD, normal RV, no significant valvular abnormalities.  He has not been seen in follow-up since.  He presented to the ED on 08/07/2022 with chest pain and palpitations.  EKG was unremarkable.  Troponin and D-dimer were normal.  He denied any exertional symptoms.  He was discharged home in stable condition and advised to follow-up with cardiology as an outpatient.  He presents today for follow-up.  Since his last visit and since his recent ED visit  Chest pain: Palpitations/PVCs: Hypertension: Disposition:  Home Medications    Current Outpatient Medications  Medication Sig Dispense Refill   cyclobenzaprine (FLEXERIL) 10 MG tablet  TAKE 1 TABLET(10 MG) BY MOUTH TWICE DAILY AS NEEDED FOR MUSCLE SPASMS     fenofibrate 160 MG tablet Take 160 mg by mouth daily.     HUMIRA PEN 40 MG/0.4ML PNKT Inject 40 mg into the skin every 14 (fourteen) days.     propranolol (INDERAL) 40 MG tablet Take 1-2 tablets by mouth every 6 (six) hours as needed for chest pain.     No current facility-administered medications for this visit.     Review of Systems    ***.  All other systems reviewed and are otherwise negative except as noted above.    Physical Exam    VS:  There were no vitals taken for this visit. , BMI There is no height or weight on file to calculate BMI.     GEN: Well nourished, well developed, in no acute distress. HEENT: normal. Neck: Supple, no JVD, carotid bruits, or masses. Cardiac: RRR, no murmurs, rubs, or gallops. No clubbing, cyanosis, edema.  Radials/DP/PT 2+ and equal bilaterally.  Respiratory:  Respirations regular and unlabored, clear to auscultation bilaterally. GI: Soft, nontender, nondistended, BS + x 4. MS: no deformity or atrophy. Skin: warm and dry, no rash. Neuro:  Strength and sensation are intact. Psych:  Normal affect.  Accessory Clinical Findings    ECG personally reviewed by me today -    - no acute changes.   Lab Results  Component Value Date   WBC 10.4 08/07/2022   HGB 14.6 08/07/2022   HCT 44.4 08/07/2022   MCV 79.7 (L) 08/07/2022   PLT 370 08/07/2022   Lab Results  Component Value Date   CREATININE 0.82 08/07/2022   BUN 14 08/07/2022   NA 135 08/07/2022   K 3.6 08/07/2022   CL 104 08/07/2022   CO2 23 08/07/2022   Lab Results  Component Value Date   ALT 14 08/07/2022   AST 15 08/07/2022   ALKPHOS 88 08/07/2022   BILITOT 0.4 08/07/2022   No results found for: "CHOL", "HDL", "LDLCALC", "LDLDIRECT", "TRIG", "CHOLHDL"  No results found for: "HGBA1C"  Assessment & Plan    1.  ***  No BP recorded.  {Refresh Note OR Click here to enter BP  :1}***   Joylene Grapes, NP 09/14/2022, 6:09 AM

## 2022-09-23 ENCOUNTER — Encounter: Payer: Self-pay | Admitting: Cardiology

## 2023-01-15 ENCOUNTER — Encounter (HOSPITAL_BASED_OUTPATIENT_CLINIC_OR_DEPARTMENT_OTHER): Payer: Self-pay | Admitting: Emergency Medicine

## 2023-01-15 ENCOUNTER — Other Ambulatory Visit: Payer: Self-pay

## 2023-01-15 ENCOUNTER — Emergency Department (HOSPITAL_BASED_OUTPATIENT_CLINIC_OR_DEPARTMENT_OTHER): Payer: 59

## 2023-01-15 ENCOUNTER — Emergency Department (HOSPITAL_BASED_OUTPATIENT_CLINIC_OR_DEPARTMENT_OTHER)
Admission: EM | Admit: 2023-01-15 | Discharge: 2023-01-15 | Disposition: A | Discharge status: Home / Self Care | Payer: 59 | Attending: Emergency Medicine | Admitting: Emergency Medicine

## 2023-01-15 DIAGNOSIS — I10 Essential (primary) hypertension: Secondary | ICD-10-CM | POA: Insufficient documentation

## 2023-01-15 DIAGNOSIS — M25561 Pain in right knee: Secondary | ICD-10-CM | POA: Diagnosis present

## 2023-01-15 MED ORDER — HYDROCODONE-ACETAMINOPHEN 5-325 MG PO TABS: 1.0000 | ORAL_TABLET | Freq: Four times a day (QID) | ORAL | 0 refills | Status: AC | PRN

## 2023-01-15 MED ORDER — ACETAMINOPHEN 500 MG PO TABS
1000.0000 mg | ORAL_TABLET | Freq: Once | ORAL | Status: AC
  Administered 2023-01-15: 1000 mg via ORAL
  Filled 2023-01-15: qty 2

## 2023-01-15 NOTE — ED Provider Notes (Signed)
[place] [facility] AT [facility] Provider Note   CSN: [id number] Arrival date & time: 01/15/23  1750     History  Chief Complaint  Patient presents with   Knee Pain    MARSHEL Bruce Valentine is a 47 y.o. male.  With a history of seizure disorder, hypertension, ankylosing spondylitis presenting to the ED for evaluation of right knee pain.  Pain is localized to the medial aspect of the right knee.  He noticed this 2.5 weeks ago.  He states he has been mostly unable to walk over the past week due to the pain.  It is worse with extension of the knee.  He denies any direct trauma to the area.  No recent falls.  He does report a mild amount of swelling.  No fevers, chills, nausea, vomiting.  No history of surgeries to the knee.  No penetrating injuries.  No erythema that he is aware of.  Pain does not radiate from the medial aspect of the knee.   Knee Pain      Home Medications Prior to Admission medications   Medication Sig Start Date End Date Taking? Authorizing Provider  HYDROcodone-acetaminophen (NORCO/VICODIN) 5-325 MG tablet Take 1 tablet by mouth every 6 (six) hours as needed. 01/15/23  Yes Ranferi Clingan, Edsel Petrin, PA-C  cyclobenzaprine (FLEXERIL) 10 MG tablet TAKE 1 TABLET(10 MG) BY MOUTH TWICE DAILY AS NEEDED FOR MUSCLE SPASMS 11/07/19   [provider]  fenofibrate 160 MG tablet Take 160 mg by mouth daily. 02/24/18   [provider]  HUMIRA PEN 40 MG/0.4ML PNKT Inject 40 mg into the skin every 14 (fourteen) days. 08/05/20   [provider]  propranolol (INDERAL) 40 MG tablet Take 1-2 tablets by mouth every 6 (six) hours as needed for chest pain. 07/10/20   [provider]      Allergies    Other    Review of Systems   Review of Systems  Musculoskeletal:  Positive for arthralgias.  All other systems reviewed and are negative.   Physical Exam Updated Vital Signs BP 114/64 (BP Location: Right Arm)   Pulse (!) 102    Temp (!) 97.5 F (36.4 C) (Oral)   Resp 16   Ht 5\' 8"  (1.727 m)   Wt 95.3 kg   SpO2 99%   BMI 31.93 kg/m  Physical Exam Vitals and nursing note reviewed.  Constitutional:      General: He is not in acute distress.    Appearance: Normal appearance. He is normal weight. He is not ill-appearing.  HENT:     Head: Normocephalic and atraumatic.  Pulmonary:     Effort: Pulmonary effort is normal. No respiratory distress.  Abdominal:     General: Abdomen is flat.  Musculoskeletal:     Cervical back: Neck supple.     Comments: Mild swelling to the right knee.  No overlying erythema or warmth.  Moderate TTP to the medial joint line.  Negative anterior and posterior drawer test.  Varus stress test negative.  Valgus stress test positive on the right, negative on the left.  Full AROM in hip and knee flexion and extension.  PT pulses 2+ bilaterally.  Sensation intact distally.  No calf tenderness or swelling.  Negative Homans' sign.  Skin:    General: Skin is warm and dry.  Neurological:     Mental Status: He is alert and oriented to person, place, and time.  Psychiatric:        Mood and  Affect: Mood normal.        Behavior: Behavior normal.     ED Results / Procedures / Treatments   Labs (all labs ordered are listed, but only abnormal results are displayed) Labs Reviewed - No data to display  EKG None  Radiology DG Knee Complete 4 Views Right  Result Date: 01/15/2023 CLINICAL DATA:  Right knee pain. EXAM: RIGHT KNEE - COMPLETE 4+ VIEW COMPARISON:  None Available. FINDINGS: No evidence of fracture, dislocation, or joint effusion. No evidence of arthropathy or other focal bone abnormality. Soft tissues are unremarkable. IMPRESSION: Negative. Electronically Signed   By: Elgie Collard M.D.   On: 01/15/2023 18:34    Procedures Procedures    Medications Ordered in ED Medications  acetaminophen (TYLENOL) tablet 1,000 mg (1,000 mg Oral Given 01/15/23 1953)    ED Course/ Medical  Decision Making/ A&P                                 Medical Decision Making Amount and/or Complexity of Data Reviewed Radiology: ordered.  Risk OTC drugs. Prescription drug management.  This patient presents to the ED for concern of right knee pain, this involves an extensive number of treatment options, and is a complaint that carries with it a high risk of complications and morbidity.  The differential diagnosis includes fracture, strain, sprain, contusion, dislocation, tendon or ligamentous injury  My initial workup includes imaging, pain control  Additional history obtained from: Nursing notes from this visit.  I ordered imaging studies including x-ray right knee I independently visualized and interpreted imaging which showed negative I agree with the radiologist interpretation  Afebrile, hemodynamically stable.  47 year old male presenting to the ED for evaluation of right knee pain.  This has gotten progressively worse over the past 2.5 weeks.  He believes he has something to do with how much pool he has been playing lately.  Pain is localized to the medial aspect of the knee without radiation.  He reports some mild swelling as well.  He has been unable to bear weight on this knee for the past week due to the pain.  He has been taking Motrin and Voltaren gel with minimal improvement.  He denies any signs or symptoms of systemic infection.  He appears well on physical exam.  There is tenderness to palpation of the right medial joint line.  The valgus stress test is positive.  Overall I do suspect a ligamentous injury as the cause of his symptoms.  Low suspicion for septic arthritis given reassuring physical exam.  Will suspicion for DVT given lack of calf pain or swelling and specific medial joint line pain.  Patient was given a knee immobilizer and crutches in the emergency department.  He was sent a prescription for Norco and educated on potential side effects.  He was given contact  information for orthopedics and encouraged to follow-up.  He was given return precautions.  Stable at discharge.  At this time there does not appear to be any evidence of an acute emergency medical condition and the patient appears stable for discharge with appropriate outpatient follow up. Diagnosis was discussed with patient who verbalizes understanding of care plan and is agreeable to discharge. I have discussed return precautions with patient who verbalizes understanding. Patient encouraged to follow-up with their PCP within 1 week. All questions answered.  Note: Portions of this report may have been transcribed using voice recognition software. Every  effort was made to ensure accuracy; however, inadvertent computerized transcription errors may still be present.        Final Clinical Impression(s) / ED Diagnoses Final diagnoses:  Acute pain of right knee    Rx / DC Orders ED Discharge Orders          Ordered    HYDROcodone-acetaminophen (NORCO/VICODIN) 5-325 MG tablet  Every 6 hours PRN        01/15/23 2010              Mora Bellman 01/15/23 2012    Arby Barrette, MD 01/15/23 2147

## 2023-01-15 NOTE — Discharge Instructions (Signed)
You have been seen today for your complaint of right knee pain. Your imaging was reassuring. Your discharge medications include Norco. This is an opioid pain medication. You should only take this medication as needed for severe pain. You should not drive, operate heavy machinery or make important decisions while taking this medication. You should use alternative methods for pain relief while taking this medication including stretching, gentle range of motion, and alternating tylenol and ibuprofen.  Do not take additional Tylenol while you are taking this medication. Follow up with: Dr. Jena Gauss.  He is an Investment banker, operational.  Call to schedule a follow-up appointment. Please seek immediate medical care if you develop any of the following symptoms: Your knee swells, and the swelling gets worse. You cannot move your knee. You have very bad knee pain that does not get better with pain medicine. At this time there does not appear to be the presence of an emergent medical condition, however there is always the potential for conditions to change. Please read and follow the below instructions.  Do not take your medicine if  develop an itchy rash, swelling in your mouth or lips, or difficulty breathing; call 911 and seek immediate emergency medical attention if this occurs.  You may review your lab tests and imaging results in their entirety on your MyChart account.  Please discuss all results of fully with your primary care provider and other specialist at your follow-up visit.  Note: Portions of this text may have been transcribed using voice recognition software. Every effort was made to ensure accuracy; however, inadvertent computerized transcription errors may still be present.

## 2023-01-15 NOTE — ED Triage Notes (Addendum)
Right knee pain for over 3 weeks.  Some swelling and pain.  Pt has tried OTC treatment but not working.  It is getting worse and Pt unable to walk on it.

## 2023-10-05 ENCOUNTER — Encounter: Payer: Self-pay | Admitting: *Deleted

## 2023-10-05 NOTE — Congregational Nurse Program (Signed)
  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 Yes    Student Assistance N/A    Location Patient Served  [facility]    Encounter Setting CN site;Phone/Text/Email    Population Status Unhoused    Insurance Unknown    Insurance/Financial Assistance Referral N/A    Medication N/A    Medical Provider Yes   per client has not seen in a few years   Screening Referrals Made N/A    Medical Referrals Made Non-[facility] PCP/Clinic    Medical Appointment Completed N/A    CNP Interventions Advocate/Support;Navigate Healthcare System;Case Management    Screenings CN Performed Blood Pressure    ED Visit Averted N/A    Life-Saving Intervention Made N/A         Client came to nurse's office reporting cough x 3 days with green sputum. Checked vitals. Blood pressure (!) 101/57, pulse 80, temperature 97.9 F (36.6 C), temperature source Oral, SpO2 97%. Client reports he has not seen PCP in several years in [place]. He currently has no insurance and is working with CM to establish. Contacted Ronal Jenkins Houseman and she agreed to see client today at [facility]. Client has a bus pass for transportation.  Jalexa Pifer W RN CN
